# Patient Record
Sex: Male | Born: 1975 | Race: Black or African American | Hispanic: No | Marital: Married | Smoking: Never smoker
Health system: Southern US, Community
[De-identification: ages and names within clinical notes are randomized; demographics above are authoritative.]

## PROBLEM LIST (undated history)

## (undated) DIAGNOSIS — M1990 Unspecified osteoarthritis, unspecified site: Secondary | ICD-10-CM

## (undated) DIAGNOSIS — E781 Pure hyperglyceridemia: Secondary | ICD-10-CM

## (undated) DIAGNOSIS — I10 Essential (primary) hypertension: Secondary | ICD-10-CM

## (undated) DIAGNOSIS — Z227 Latent tuberculosis: Secondary | ICD-10-CM

## (undated) DIAGNOSIS — M21852 Other specified acquired deformities of left thigh: Secondary | ICD-10-CM

## (undated) HISTORY — DX: Other specified acquired deformities of left thigh: M21.852

## (undated) HISTORY — DX: Pure hyperglyceridemia: E78.1

## (undated) HISTORY — DX: Essential (primary) hypertension: I10

---

## 2019-07-04 ENCOUNTER — Encounter: Payer: Self-pay | Admitting: Family Medicine

## 2019-07-04 ENCOUNTER — Other Ambulatory Visit: Payer: Self-pay

## 2019-07-04 ENCOUNTER — Ambulatory Visit (INDEPENDENT_AMBULATORY_CARE_PROVIDER_SITE_OTHER): Payer: Medicaid Other | Admitting: Family Medicine

## 2019-07-04 VITALS — BP 146/86 | HR 84 | Ht 62.5 in | Wt 147.4 lb

## 2019-07-04 DIAGNOSIS — M25552 Pain in left hip: Secondary | ICD-10-CM | POA: Diagnosis not present

## 2019-07-04 DIAGNOSIS — I10 Essential (primary) hypertension: Secondary | ICD-10-CM

## 2019-07-04 DIAGNOSIS — Z23 Encounter for immunization: Secondary | ICD-10-CM | POA: Diagnosis not present

## 2019-07-04 DIAGNOSIS — Z0289 Encounter for other administrative examinations: Secondary | ICD-10-CM

## 2019-07-04 DIAGNOSIS — M21852 Other specified acquired deformities of left thigh: Secondary | ICD-10-CM | POA: Insufficient documentation

## 2019-07-04 LAB — POCT URINALYSIS DIP (MANUAL ENTRY)
Bilirubin, UA: NEGATIVE
Blood, UA: NEGATIVE
Glucose, UA: NEGATIVE mg/dL
Ketones, POC UA: NEGATIVE mg/dL
Leukocytes, UA: NEGATIVE
Nitrite, UA: NEGATIVE
Protein Ur, POC: NEGATIVE mg/dL
Spec Grav, UA: 1.02 (ref 1.010–1.025)
Urobilinogen, UA: 0.2 E.U./dL
pH, UA: 6 (ref 5.0–8.0)

## 2019-07-04 MED ORDER — AMLODIPINE BESYLATE 10 MG PO TABS: 10.0000 mg | ORAL_TABLET | Freq: Every day | ORAL | 3 refills | Status: DC

## 2019-07-04 MED ORDER — LOSARTAN POTASSIUM-HCTZ 50-12.5 MG PO TABS: 1.0000 | ORAL_TABLET | Freq: Every day | ORAL | 3 refills | Status: DC

## 2019-07-04 NOTE — Patient Instructions (Signed)
I will call you with labs  Please go down the street for x-rays  Your blood pressure pills are at your pharmacy  It was wonderful to see you today.  Please bring ALL of your medications with you to every visit.   Thank you for choosing [facility].   Please call [phone] with any questions about today's appointment.  Please be sure to schedule follow up at the front  desk before you leave today.   Terisa Starr, MD  Family Medicine

## 2019-07-04 NOTE — Progress Notes (Signed)
Patient Name: Jerry Patel Date of Birth: [birth year] Date of Visit: 07/04/19 PCP: Jerry Chandler, MD  Chief Complaint: refugee intake examination and leg pain   The patient's preferred language is Swahili. An interpreter was used for the entire visit.  Interpreter Name or ID: Jerry Patel     Subjective: Jerry Patel is a pleasant 44 y.o. presenting today for an initial refugee and immigrant clinic visit.    The patient reports a 10-year history of left leg pain.  He has noticed this when he is playing soccer and feels like he has pain that extends from his hip inferiorly towards his left knee.  He denies a history of trauma.  He reports this onset 10 years ago.  He denies numbness or tingling.  He denies back pain.  He reports weakness in his left leg as compared to his right.  He has noticed a leg length discrepancy.  He has tried nothing for this. He has had no prior imaging. Left leg pain  The patient has a history of hypertension.  He is currently on losartan 50 mg in combination with HCTZ 12.5.  He also takes 10 of amlodipine.  He reports compliance with these medications.  Denies chest pain, vision changes, difficulty breathing.  He reports he was diagnosed with hypertension just a few years ago.  ROS: Negative for headaches, vision changes, chest pain, difficulty breathing. The patient importantly had a chest x-ray overseas which showed pleural thickening and requires follow-up.  PMH: Hypertension Left leg pain for 10 years Abnormal x-ray overseas with negative QuantiFERON gold PSH: No prior surgeries  FH: Reports his father had bilateral leg pain for decades he is unsure what he passed away from.  His mom had a left neck mass that would swell and drain and he believes this resulted in her death.  No other significant family history no family history of early cardiac disease or diabetes  Current Medications:  Losartan 50 mg and combination with HCTZ 12.5 Amlodipine 10  mg  Refugee Information Number of Immediate Family Members: 6(Three children and 2 step children) Number of Immediate Family Members in [place]: 0(Brother lives in [place]) Date of Arrival: 06/21/19 Country of Birth: [facility] Country of Origin: ([place]) [place] Refuge[place]: [place] Duration in [place]: 20 years or greater Reason for Leaving Home Country: Oceanographer opinion Primary Language: Swahili/Kiswahili, English, Other, French(Kibebeme, Mashi, Lingala) Able to Read in Primary Language: Yes Able to Write in Primary Language: Yes Education: [employer] Prior Work: Runner, broadcasting/film/video in [place] Marital Status: Married(Jerry Patel Programmer, systems) Sexual Activity: Yes Tuberculosis Screening Overseas: Negative History of Trauma: Other(Witness to war in [facility]) Do You Feel Jumpy or Nervous?: Yes Are You Very Watchful or 'Super Alert'?: No  The patient's wife's children and stepchildren remain in the [place] and refugee camp at this time.  Date of Overseas Exam: May 02, 2019 Review of Overseas Exam: Today reviewed Pre-Departure Treatment: Will check EDN   Jerry Patel  Vitals:   07/04/19 1341  BP: (!) 146/86  Pulse: 84  SpO2: 100%   HEENT: Sclera anicteric. Dentition is Moderate . Appears well hydrated. Neck: Supple Cardiac: Regular rate and rhythm. Normal S1/S2. No murmurs, rubs, or gallops appreciated. Lungs: Clear bilaterally to ascultation.  Abdomen: Normoactive bowel sounds. No tenderness to deep or light palpation. No rebound or guarding. Splenomegaly-renomegaly.  Mildly enlarged liver. Extremities: Warm, well perfused without edema.  Skin: No rashes or scars Psych: Pleasant and appropriate  MSK: Gait walks with favoring of right  leg as compared to left.  Noticeable difference in muscle bulk of the left quadriceps as compared to right. There is a 1.5 cm leg length discrepancy. Reduced range of motion with internal and external rotation of the hip on the left as compared to right  he has good flexion but limited extension of the left hip.  No tenderness to palpation over the bursa.  No tenderness to palpation of the back he has no pain in his SI joint with external rotation.  Jerry Patel was seen today for establish care.  Diagnoses and all orders for this visit:  Refugee health examination -     RPR -     HIV Antibody (routine testing w rflx) -     CBC with Differential/Platelet -     Comprehensive metabolic panel -     Hepatitis B surface antigen -     Hepatitis B core antibody, total -     Hepatitis B surface antibody,quantitative -     POCT urinalysis dipstick -     Lipid panel -     TSH -     Varicella zoster antibody, IgG -     HCV Ab w Reflex to Quant PCR -     Strongyloides, Ab, IgG -     DG Chest 2 View; Future -we will obtain this due to his history of abnormal x-ray he denies symptoms today. -     QuantiFERON-TB Gold Plus -     Hgb Fractionation Cascade  Need for vaccine for Td (tetanus-diphtheria) -     Tdap vaccine greater than or equal to 7yo IM  Need for hepatitis A immunization -     Hepatitis A vaccine adult IM  Left hip pain differential is broad here I suspect pain this may represent femoral acetabular impingement, although this would be less likely given his leg length discrepancy.  Although he reports this is been ongoing for 10 years I do wonder if he has developmental dysplasia of the left hip that subsequently resulted in avascular necrosis or early osteoarthritis.  Will obtain imaging and likely refer to sports medicine versus orthopedics pending results. -     DG Hip Unilat W OR W/O Pelvis Min 4 Views Left; Future  Essential hypertension slightly above goal.  He had a normal creatinine overseas.  We will repeat his blood pressure at follow-up and titrate medications accordingly.  He is given a refill today on the below medications. -     losartan-hydrochlorothiazide (HYZAAR) 50-12.5 MG tablet; Take 1 tablet by mouth daily. -      amLODipine (NORVASC) 10 MG tablet; Take 1 tablet (10 mg total) by mouth at bedtime.   Return to care in 1 month in [facility] in [place], MD  [facility] Medicine Teaching Service

## 2019-07-06 ENCOUNTER — Other Ambulatory Visit: Payer: Self-pay

## 2019-07-06 ENCOUNTER — Ambulatory Visit
Admission: RE | Admit: 2019-07-06 | Discharge: 2019-07-06 | Disposition: A | Discharge status: Home / Self Care | Payer: Medicaid Other | Source: Ambulatory Visit | Attending: Family Medicine | Admitting: Family Medicine

## 2019-07-06 ENCOUNTER — Other Ambulatory Visit: Payer: Self-pay | Admitting: Family Medicine

## 2019-07-06 DIAGNOSIS — M25552 Pain in left hip: Secondary | ICD-10-CM

## 2019-07-06 DIAGNOSIS — Z0289 Encounter for other administrative examinations: Secondary | ICD-10-CM

## 2019-07-07 LAB — QUANTIFERON-TB GOLD PLUS
QuantiFERON Mitogen Value: >10 IU/mL
QuantiFERON Nil Value: 0.05 IU/mL
QuantiFERON TB1 Ag Value: 3.84 IU/mL
QuantiFERON TB2 Ag Value: 3.42 IU/mL
QuantiFERON-TB Gold Plus: POSITIVE — AB

## 2019-07-07 LAB — COMPREHENSIVE METABOLIC PANEL
ALT: 27 IU/L (ref 0–44)
AST: 25 IU/L (ref 0–40)
Albumin/Globulin Ratio: 1.6 (ref 1.2–2.2)
Albumin: 4.6 g/dL (ref 4.0–5.0)
Alkaline Phosphatase: 85 IU/L (ref 48–121)
BUN/Creatinine Ratio: 14 (ref 9–20)
BUN: 15 mg/dL (ref 6–24)
Bilirubin Total: 0.5 mg/dL (ref 0.0–1.2)
CO2: 24 mmol/L (ref 20–29)
Calcium: 9.6 mg/dL (ref 8.7–10.2)
Chloride: 98 mmol/L (ref 96–106)
Creatinine, Ser: 1.05 mg/dL (ref 0.76–1.27)
GFR calc Af Amer: 101 mL/min/{1.73_m2} (ref >59)
GFR calc non Af Amer: 87 mL/min/{1.73_m2} (ref >59)
Globulin, Total: 2.9 g/dL (ref 1.5–4.5)
Glucose: 107 mg/dL — ABNORMAL HIGH (ref 65–99)
Potassium: 4.1 mmol/L (ref 3.5–5.2)
Sodium: 138 mmol/L (ref 134–144)
Total Protein: 7.5 g/dL (ref 6.0–8.5)

## 2019-07-07 LAB — CBC WITH DIFFERENTIAL/PLATELET
Basophils Absolute: 0 10*3/uL (ref 0.0–0.2)
Basos: 1 %
EOS (ABSOLUTE): 1.1 10*3/uL — ABNORMAL HIGH (ref 0.0–0.4)
Eos: 14 %
Hematocrit: 44.1 % (ref 37.5–51.0)
Hemoglobin: 14.4 g/dL (ref 13.0–17.7)
Immature Grans (Abs): 0 10*3/uL (ref 0.0–0.1)
Immature Granulocytes: 0 %
Lymphocytes Absolute: 2.3 10*3/uL (ref 0.7–3.1)
Lymphs: 31 %
MCH: 27.4 pg (ref 26.6–33.0)
MCHC: 32.7 g/dL (ref 31.5–35.7)
MCV: 84 fL (ref 79–97)
Monocytes Absolute: 0.4 10*3/uL (ref 0.1–0.9)
Monocytes: 5 %
Neutrophils Absolute: 3.7 10*3/uL (ref 1.4–7.0)
Neutrophils: 49 %
Platelets: 299 10*3/uL (ref 150–450)
RBC: 5.26 x10E6/uL (ref 4.14–5.80)
RDW: 12.8 % (ref 11.6–15.4)
WBC: 7.5 10*3/uL (ref 3.4–10.8)

## 2019-07-07 LAB — LIPID PANEL
Chol/HDL Ratio: 3.7 ratio (ref 0.0–5.0)
Cholesterol, Total: 202 mg/dL — ABNORMAL HIGH (ref 100–199)
HDL: 55 mg/dL (ref >39)
LDL Chol Calc (NIH): 97 mg/dL (ref 0–99)
Triglycerides: 300 mg/dL — ABNORMAL HIGH (ref 0–149)
VLDL Cholesterol Cal: 50 mg/dL — ABNORMAL HIGH (ref 5–40)

## 2019-07-07 LAB — HIV ANTIBODY (ROUTINE TESTING W REFLEX): HIV Screen 4th Generation wRfx: NONREACTIVE

## 2019-07-07 LAB — HGB FRACTIONATION CASCADE
Hgb A2: 2.7 % (ref 1.8–3.2)
Hgb A: 97.3 % (ref 96.4–98.8)
Hgb F: 0 % (ref 0.0–2.0)
Hgb S: 0 %

## 2019-07-07 LAB — HCV AB W REFLEX TO QUANT PCR: HCV Ab: <0.1 s/co ratio (ref 0.0–0.9)

## 2019-07-07 LAB — HEPATITIS B CORE ANTIBODY, TOTAL: Hep B Core Total Ab: NEGATIVE

## 2019-07-07 LAB — TSH: TSH: 0.878 u[IU]/mL (ref 0.450–4.500)

## 2019-07-07 LAB — HEPATITIS B SURFACE ANTIBODY, QUANTITATIVE: Hepatitis B Surf Ab Quant: >1000 m[IU]/mL (ref >9.9)

## 2019-07-07 LAB — HCV INTERPRETATION

## 2019-07-07 LAB — HEPATITIS B SURFACE ANTIGEN: Hepatitis B Surface Ag: NEGATIVE

## 2019-07-07 LAB — VARICELLA ZOSTER ANTIBODY, IGG: Varicella zoster IgG: 615 index (ref >165)

## 2019-07-07 LAB — RPR: RPR Ser Ql: NONREACTIVE

## 2019-07-11 ENCOUNTER — Telehealth: Payer: Self-pay | Admitting: Family Medicine

## 2019-07-11 NOTE — Telephone Encounter (Signed)
Faxed required information to below number.   Veronda Prude, RN

## 2019-07-11 NOTE — Telephone Encounter (Signed)
IGRA positive, CXR (repeat) negative.   Called Jerry Patel re: latent Tb therapy.   Please fax Tb result and CXR result (in RN clinic box) to Jerry Patel  [phone]  Terisa Starr, MD  Family Medicine Teaching Service

## 2019-07-12 ENCOUNTER — Encounter: Payer: Self-pay | Admitting: Family Medicine

## 2019-07-12 ENCOUNTER — Telehealth: Payer: Self-pay | Admitting: Family Medicine

## 2019-07-12 NOTE — Telephone Encounter (Signed)
The patient speaks Swahili as their primary language.  An interpreter was used for the entire visit.   Left generic voicemail to call back.   Will send letter.  Has follow up scheduled.   Left Hip Dysplasia- Referral to [facility].  Latent Tb- already notified HD. Eosinophilia- Still awaiting strongyloides.   Terisa Starr, MD  Family Medicine Teaching Service

## 2019-07-14 LAB — STRONGYLOIDES, AB, IGG: Strongyloides, Ab, IgG: POSITIVE — AB

## 2019-07-20 ENCOUNTER — Telehealth: Payer: Self-pay | Admitting: Family Medicine

## 2019-07-20 DIAGNOSIS — Q6589 Other specified congenital deformities of hip: Secondary | ICD-10-CM

## 2019-07-20 NOTE — Telephone Encounter (Addendum)
The patient speaks Swahili as their primary language.  An interpreter was used for the entire visit.   Left hip dysplasia, discussed. Recommended referral to Orthopedics. Recommend Dr. Magnus Ivan.   Strongyloides- Will need [place] thick/thin smear between 10 AM to 2 PM.   Latent Tb- already called health department about latent Tb. Informed patient, he is asymptomatic.  Already started Rx.   All questions answered.   Terisa Starr, MD  Family Medicine Teaching Service

## 2019-07-25 ENCOUNTER — Ambulatory Visit (INDEPENDENT_AMBULATORY_CARE_PROVIDER_SITE_OTHER): Payer: Medicaid Other | Admitting: Family Medicine

## 2019-07-25 ENCOUNTER — Other Ambulatory Visit: Payer: Self-pay

## 2019-07-25 VITALS — BP 146/88 | HR 64 | Ht 63.0 in | Wt 150.0 lb

## 2019-07-25 DIAGNOSIS — I10 Essential (primary) hypertension: Secondary | ICD-10-CM

## 2019-07-25 DIAGNOSIS — Z227 Latent tuberculosis: Secondary | ICD-10-CM | POA: Diagnosis not present

## 2019-07-25 DIAGNOSIS — D7219 Other eosinophilia: Secondary | ICD-10-CM

## 2019-07-25 DIAGNOSIS — M21852 Other specified acquired deformities of left thigh: Secondary | ICD-10-CM | POA: Diagnosis not present

## 2019-07-25 DIAGNOSIS — D721 Eosinophilia, unspecified: Secondary | ICD-10-CM | POA: Insufficient documentation

## 2019-07-25 DIAGNOSIS — D7218 Eosinophilia in diseases classified elsewhere: Secondary | ICD-10-CM | POA: Diagnosis present

## 2019-07-25 DIAGNOSIS — E781 Pure hyperglyceridemia: Secondary | ICD-10-CM

## 2019-07-25 MED ORDER — LOSARTAN POTASSIUM-HCTZ 50-12.5 MG PO TABS: 2.0000 | ORAL_TABLET | Freq: Every day | ORAL | 3 refills | Status: DC

## 2019-07-25 NOTE — Progress Notes (Addendum)
    SUBJECTIVE:   CHIEF COMPLAINT / HPI:   An in-person Swahili interpreter was present throughout entire visit  Left leg pain Patient reports that he continues to have left leg pain, although he has an appointment with an orthopedist tomorrow to discuss this further.  He says that it hurts most when he has been walking for a prolonged period of time.  He is able to play soccer and do most activities, but his leg pain continues to be a problem especially with activity.  He denies any numbness, tingling, and weakness.  Latent tuberculosis Patient says that he has started a regimen prescribed by the health department.  He has not had any difficulties with the medication so far.  He understands that he will need to take this regimen for several months.  Hypertension Patient reports that he continues to take amlodipine 10 mg and losartan-HCTZ 50-12.5 mg daily.  He took these medications today.  He has had no concerning side effects, although he is worried that his blood pressure is too high today.  Hypertriglyceridemia When asked about alcohol consumption, patient reported that he did used to drink 2-3 servings of alcohol per day but quit 2 weeks ago when he started his medication for hypertension.  He has had no difficulties abstaining from alcohol since then.  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  No tenderness to palpation along the ischial spine, lateral hip, and groin.   Logroll is negative.  ROM is reduced on FADIR and FABER but normal on flexion and extension.  5/5 strength on hip flexion, extension, adduction, and abduction.  Normal gait. Psych: appropriate mood and affect    ASSESSMENT/PLAN:   Essential hypertension Above goal today, even after recheck.  We will increase patient's losartan-HCTZ to 100-25 mg daily.  In order to keep his regimen simple, will have him take 2 of these pills per day.  We will continue amlodipine 10 mg.  Patient has a follow-up appointment on July 16, when he can get a BMP to monitor his kidney function and a blood pressure recheck.  Acquired dysplasia of hip, left Reviewed with the patient that his hip pain is due to a structural deformity that is best addressed by the orthopedist, so his appointment tomorrow will be very important.  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  We will notify him of these results and plan for treatment once results return.  Latent tuberculosis Discussed the importance of treating this, and ensured that patient was receiving treatment with the health department.  Hypertriglyceridemia Congratulated patient on his abstinence from alcohol and discussed the downstream effects of alcohol overuse.     10-16-1988, MD [facility]

## 2019-07-25 NOTE — Assessment & Plan Note (Signed)
Reviewed with the patient that his hip pain is due to a structural deformity that is best addressed by the orthopedist, so his appointment tomorrow will be very important.

## 2019-07-25 NOTE — Assessment & Plan Note (Signed)
Thick and thin parasite smear obtained today before 2 PM.  We will notify him of these results and plan for treatment once results return.

## 2019-07-25 NOTE — Assessment & Plan Note (Signed)
Congratulated patient on his abstinence from alcohol and discussed the downstream effects of alcohol overuse.

## 2019-07-25 NOTE — Assessment & Plan Note (Signed)
Above goal today, even after recheck.  We will increase patient's losartan-HCTZ to 100-25 mg daily.  In order to keep his regimen simple, will have him take 2 of these pills per day.  We will continue amlodipine 10 mg.  Patient has a follow-up appointment on July 16, when he can get a BMP to monitor his kidney function and a blood pressure recheck.

## 2019-07-25 NOTE — Patient Instructions (Addendum)
It was nice meeting you today Mr. Jerry Patel!  Please increase your losartan to 2 pills/day.  Dr. Manson Passey will call you with your lab results.  Please go to your appointment with the bone doctor tomorrow.  Please continue to take medicine for your tuberculosis.  We will see you again on July 16 at 9:30 with Dr. Manson Passey to follow-up on your blood pressure.  If you have any questions or concerns, please feel free to call the clinic.   Be well,  Dr. Frances Furbish

## 2019-07-25 NOTE — Assessment & Plan Note (Signed)
Discussed the importance of treating this, and ensured that patient was receiving treatment with the health department.

## 2019-07-26 ENCOUNTER — Ambulatory Visit (INDEPENDENT_AMBULATORY_CARE_PROVIDER_SITE_OTHER): Payer: Medicaid Other

## 2019-07-26 ENCOUNTER — Telehealth: Payer: Self-pay | Admitting: Family Medicine

## 2019-07-26 ENCOUNTER — Encounter: Payer: Self-pay | Admitting: Orthopaedic Surgery

## 2019-07-26 ENCOUNTER — Ambulatory Visit (INDEPENDENT_AMBULATORY_CARE_PROVIDER_SITE_OTHER): Payer: Medicaid Other | Admitting: Orthopaedic Surgery

## 2019-07-26 DIAGNOSIS — B789 Strongyloidiasis, unspecified: Secondary | ICD-10-CM

## 2019-07-26 DIAGNOSIS — M21852 Other specified acquired deformities of left thigh: Secondary | ICD-10-CM | POA: Diagnosis not present

## 2019-07-26 NOTE — Telephone Encounter (Signed)
The patient speaks Swahili as their primary language.  An interpreter was used for the entire visit.   Called with preliminary negative, will await final result before Rx for ivermectin.

## 2019-07-26 NOTE — Progress Notes (Signed)
Office Visit Note   Patient: Jerry Patel           Date of Birth: 1976-01-26           MRN: [id number] Visit Date: 07/26/2019              Requested by: [name], MD [address] [place],  [place] [address] PCP: [name], MD   Assessment & Plan: Visit Diagnoses:  1. Acquired dysplasia of hip, left     Plan: Impression is left hip dysplasia and secondary DJD.  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  Unfortunately I do not feel that any nonsurgical treatments are going to give him any prolonged relief as he has a severe structural problem.  Based on discussion on risks benefits rehab recovery alternatives to left total hip replacement he has elected to proceed with the surgery in the near future.  We will first obtain a CT scan to better evaluate his left hip joint and for surgical planning.  We will call the patient to schedule the surgery once we have the results of the CT scan.  Follow-Up Instructions: Return if symptoms worsen or fail to improve.   Orders:  Orders Placed This Encounter  Procedures  . XR HIP UNILAT W OR W/O PELVIS 2-3 VIEWS LEFT  . CT PELVIS WO CONTRAST   No orders of the defined types were placed in this encounter.     Procedures: No procedures performed   Clinical Data: No additional findings.   Subjective: Chief Complaint  Patient presents with  . Left Hip - Pain    Patient is a 44 year old African gentleman who comes in with his interpreter for chronic left hip pain.  He has had left hip and groin pain for 15 years.  He states that he was diagnosed with a hip issue as a child.  He has pain with walking long distances.  Currently just taking over-the-counter medications.  He is severely limited by his left hip and groin pain at this point.   Review of Systems  Constitutional: Negative.   All other systems reviewed and are negative.    Objective: Vital Signs: There were  no vitals taken for this visit.  Physical Exam Vitals and nursing note reviewed.  Constitutional:      Appearance: He is well-developed.  HENT:     Head: Normocephalic and atraumatic.  Eyes:     Pupils: Pupils are equal, round, and reactive to light.  Pulmonary:     Effort: Pulmonary effort is normal.  Abdominal:     Palpations: Abdomen is soft.  Musculoskeletal:        General: Normal range of motion.     Cervical back: Neck supple.  Skin:    General: Skin is warm.  Neurological:     Mental Status: He is alert and oriented to person, place, and time.  Psychiatric:        Behavior: Behavior normal.        Thought Content: Thought content normal.        Judgment: Judgment normal.     Ortho Exam Left lower extremity shows about a 1inch leg length discrepancy.  He has minimal internal rotation with pain no lateral hip tenderness.  Positive logroll. Specialty Comments:  No specialty comments available.  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 Latent tuberculosis 07/25/2019  . Hypertriglyceridemia 07/25/2019  . Refugee health examination 07/04/2019  . Essential hypertension 07/04/2019  . Acquired dysplasia of hip, left 07/04/2019   Past Medical History:  Diagnosis Date  . Hypertension     History reviewed. No pertinent family history.  History reviewed. No pertinent surgical history. Social History   Occupational History  . Not on file  Tobacco Use  . Smoking status: Never Smoker  . Smokeless tobacco: Never Used  Substance and Sexual Activity  . Alcohol use: Yes    Alcohol/week: 2.0 standard drinks    Types: 2 Cans of beer per week  . Drug use: Never  . Sexual activity: Yes    Partners: Female    Comment: Wife- [name]- in  [place]

## 2019-08-01 LAB — PARASITE EXAM, BLOOD

## 2019-08-03 ENCOUNTER — Telehealth: Payer: Self-pay | Admitting: Family Medicine

## 2019-08-03 MED ORDER — IVERMECTIN 3 MG PO TABS: 200.0000 ug/kg | ORAL_TABLET | Freq: Once | ORAL | 0 refills | Status: AC

## 2019-08-03 NOTE — Telephone Encounter (Signed)
The patient speaks Swahili as their primary language.  An interpreter was used for the entire visit.   Called patient with positive strongyloides, from [place] endemic area. Thick and thin smear negative for [place].   Rx for single 200 mcg/kg/day of ivermectin to pharmacy. All questions answered.   Terisa Starr, MD  Family Medicine Teaching Service

## 2019-08-07 ENCOUNTER — Ambulatory Visit
Admission: RE | Admit: 2019-08-07 | Discharge: 2019-08-07 | Disposition: A | Discharge status: Home / Self Care | Payer: Medicaid Other | Source: Ambulatory Visit | Attending: Orthopaedic Surgery | Admitting: Orthopaedic Surgery

## 2019-08-07 DIAGNOSIS — M21852 Other specified acquired deformities of left thigh: Secondary | ICD-10-CM

## 2019-08-08 NOTE — Progress Notes (Signed)
Left voicemail with patient to call back

## 2019-08-09 ENCOUNTER — Telehealth: Payer: Self-pay

## 2019-08-09 NOTE — Telephone Encounter (Signed)
Tarry Kos, MD  Jerry Patel, RMA Left voicemail with patient to call back.       CT PELVIS WO CONTRAST

## 2019-08-25 ENCOUNTER — Encounter: Payer: Self-pay | Admitting: Family Medicine

## 2019-08-25 ENCOUNTER — Telehealth: Payer: Self-pay | Admitting: Orthopaedic Surgery

## 2019-08-25 ENCOUNTER — Ambulatory Visit: Payer: Medicaid Other | Admitting: Family Medicine

## 2019-08-25 ENCOUNTER — Other Ambulatory Visit: Payer: Self-pay

## 2019-08-25 VITALS — BP 118/82 | HR 69 | Wt 147.2 lb

## 2019-08-25 DIAGNOSIS — D7219 Other eosinophilia: Secondary | ICD-10-CM

## 2019-08-25 DIAGNOSIS — Z227 Latent tuberculosis: Secondary | ICD-10-CM

## 2019-08-25 DIAGNOSIS — M21852 Other specified acquired deformities of left thigh: Secondary | ICD-10-CM

## 2019-08-25 DIAGNOSIS — I10 Essential (primary) hypertension: Secondary | ICD-10-CM | POA: Diagnosis not present

## 2019-08-25 NOTE — Patient Instructions (Addendum)
It was wonderful to see you today.  Please bring ALL of your medications with you to every visit.   Today we talked about:  - Call Dr. Warren Danes office about your CT scan results [phone]  - I will call with results of blood work      Thank you for choosing [facility].   Please call [phone] with any questions about today's appointment.  Please be sure to schedule follow up at the front  desk before you leave today.   Terisa Starr, MD  Family Medicine

## 2019-08-25 NOTE — Telephone Encounter (Signed)
Patient called. He would like a call from  Dr. Roda Shutters. concerning test results. His call back number is [phone]

## 2019-08-25 NOTE — Telephone Encounter (Signed)
Jerry Patel will call with interpreter on the phone.

## 2019-08-25 NOTE — Assessment & Plan Note (Signed)
Follow up at next visit to ensure completion of therapy.

## 2019-08-25 NOTE — Assessment & Plan Note (Signed)
Repeat CBC in fall 2021. Discussed.

## 2019-08-25 NOTE — Assessment & Plan Note (Signed)
Discussed CT with patient briefly, number given to Orthopedics. Messaged RMA as patient reports he has had difficulty calling due to language barrier.

## 2019-08-25 NOTE — Progress Notes (Signed)
    SUBJECTIVE:   CHIEF COMPLAINT / HPI:   The patient speaks Swahili as their primary language.  An interpreter was used for the entire visit.   Jerry Patel is a 44 year old with history of latent Tb, left hip dysplasia, hypertension, and hypertriglyceridemia presenting for follow up.  The patient reports he would like to review CT findings. Endorses left  Hip pain at work. He works in a plant and moves boxes all day. His left hip intermittently causes pain. This limits his ability to work and play soccer. Pain is controlled with Tylenol. Denies numbness, weakness, falls.  He reports compliance with his amlodipine, losartan, and HCTZ. Denies dizziness, chest pain, shortness of breath, headaches. He is working on dietary changes.   He received his first dose of COVID vaccine, next due on 7/24.   PERTINENT  PMH / PSH/Family/Social History :  Latent Tb Hypertriglyceridemia Hypertension    OBJECTIVE:   BP 118/82   Pulse 69   Wt 147 lb 3.2 oz (66.8 kg)   SpO2 98%   BMI 26.08 kg/m   HEENT: Sclera anicteric. Dentition is moderate. Appears well hydrated. Neck: Supple Cardiac: Regular rate and rhythm. Normal S1/S2. No murmurs, rubs, or gallops appreciated. Lungs: Clear bilaterally to ascultation.   Skin: Warm, dry Psych: Pleasant and appropriate   ASSESSMENT/PLAN:   Acquired dysplasia of hip, left Discussed CT with patient briefly, number given to Orthopedics. Messaged RMA as patient reports he has had difficulty calling due to language barrier.   Latent tuberculosis Follow up at next visit to ensure completion of therapy.   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 Discussed.   Essential hypertension Reviewed medications, well controlled, will continue HCTZ/Losartan/Norvasc.  BMP today as recently restarted diuretic, requires monitoring.    Terisa Starr, MD  Family Medicine Teaching Service  [facility]

## 2019-08-25 NOTE — Assessment & Plan Note (Signed)
Reviewed medications, well controlled, will continue HCTZ/Losartan/Norvasc.  BMP today as recently restarted diuretic, requires monitoring.

## 2019-08-26 LAB — BASIC METABOLIC PANEL
BUN/Creatinine Ratio: 14 (ref 9–20)
BUN: 15 mg/dL (ref 6–24)
CO2: 26 mmol/L (ref 20–29)
Calcium: 9.4 mg/dL (ref 8.7–10.2)
Chloride: 98 mmol/L (ref 96–106)
Creatinine, Ser: 1.09 mg/dL (ref 0.76–1.27)
GFR calc Af Amer: 96 mL/min/{1.73_m2} (ref >59)
GFR calc non Af Amer: 83 mL/min/{1.73_m2} (ref >59)
Glucose: 86 mg/dL (ref 65–99)
Potassium: 3.8 mmol/L (ref 3.5–5.2)
Sodium: 138 mmol/L (ref 134–144)

## 2019-08-27 ENCOUNTER — Encounter: Payer: Self-pay | Admitting: Family Medicine

## 2019-09-12 ENCOUNTER — Ambulatory Visit (INDEPENDENT_AMBULATORY_CARE_PROVIDER_SITE_OTHER): Payer: Medicaid Other | Admitting: Orthopaedic Surgery

## 2019-09-12 ENCOUNTER — Encounter: Payer: Self-pay | Admitting: Orthopaedic Surgery

## 2019-09-12 DIAGNOSIS — M21852 Other specified acquired deformities of left thigh: Secondary | ICD-10-CM

## 2019-09-12 NOTE — Progress Notes (Signed)
   Office Visit Note   Patient: Jerry Patel           Date of Birth: 10/08/1975           MRN: [id number] Visit Date: 09/12/2019              Requested by: Jerry Chandler, MD [address] [place],  [place] [address] PCP: Jerry Chandler, MD   Assessment & Plan: Visit Diagnoses:  1. Acquired dysplasia of hip, left     Plan: I reviewed the CT scan with the patient in detail.  He has a collapsed femoral head and dysplasia of the left acetabulum with 2 intra-articular loose bodies.  He has large subchondral cysts consistent with severe degenerative joint disease.  All these findings were reviewed with the patient in detail and recommendation has been made for a total hip replacement.  Risk benefits rehab recovery all reviewed today with the patient in detail through the interpreter.  Questions encouraged and answered.  He would like to move forward with the surgery as soon as possible. Total face to face encounter time was greater than 25 minutes and over half of this time was spent in counseling and/or coordination of care.  Follow-Up Instructions: Return for 2 week postop visit.   Orders:  No orders of the defined types were placed in this encounter.  No orders of the defined types were placed in this encounter.     Procedures: No procedures performed   Clinical Data: No additional findings.   Subjective: Chief Complaint  Patient presents with  . Left Hip - Pain, Follow-up    Patient returns today with his interpreter.  Recently had CT scanning for surgical planning.  No changes in hip symptoms.   Review of Systems   Objective: Vital Signs: There were no vitals taken for this visit.  Physical Exam  Ortho Exam Left hip exam is unchanged. Specialty Comments:  No specialty comments available.  Imaging: No results found.   PMFS History: Patient Active Problem List   Diagnosis Date Noted  . 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 Latent  tuberculosis 07/25/2019  . Hypertriglyceridemia 07/25/2019  . Refugee health examination 07/04/2019  . Essential hypertension 07/04/2019  . Acquired dysplasia of hip, left 07/04/2019   Past Medical History:  Diagnosis Date  . Hip dysplasia, acquired, left   . Hypertension   . Hypertriglyceridemia   . TB lung, latent     History reviewed. No pertinent family history.  History reviewed. No pertinent surgical history. Social History   Occupational History  . Not on file  Tobacco Use  . Smoking status: Never Smoker  . Smokeless tobacco: Never Used  Substance and Sexual Activity  . Alcohol use: Yes    Alcohol/week: 2.0 standard drinks    Types: 2 Cans of beer per week  . Drug use: Never  . Sexual activity: Yes    Partners: Female    Comment: Wife- Jerry Patel- in [place]

## 2019-09-18 ENCOUNTER — Telehealth: Payer: Self-pay | Admitting: *Deleted

## 2019-09-18 NOTE — Telephone Encounter (Signed)
-----   Message from [name], MD sent at 09/11/2019  7:26 AM EDT ----- Regarding: Sept Appt Hi Red Team,  Please schedule this patient for follow up with me for labs sometime in September.  Thanks, Terisa Starr, MD  [facility] Medicine Teaching Service

## 2019-09-18 NOTE — Telephone Encounter (Signed)
Pt informed and scheduled using swahilli interpreter Arley Phenix [phone]. Jamoni Broadfoot Bruna Potter, CMA

## 2019-09-19 ENCOUNTER — Other Ambulatory Visit: Payer: Self-pay

## 2019-09-26 ENCOUNTER — Encounter (HOSPITAL_COMMUNITY): Payer: Self-pay | Admitting: Physician Assistant

## 2019-09-26 ENCOUNTER — Encounter (HOSPITAL_COMMUNITY): Payer: Self-pay

## 2019-09-26 NOTE — Progress Notes (Addendum)
COVID Vaccine Completed: Yes Date COVID Vaccine completed: 09/02/19 COVID vaccine manufacturer: Pfizer       PCP - Dr. Terisa Starr last office visit note 08/25/19 in epic Cardiologist - N/A  Chest x-ray - 07/06/19 in epic EKG - 09/29/19 in epic (pre op) Stress Test - N/A ECHO - N/A Cardiac Cath - N/A  Sleep Study - N/A CPAP - N/A  Fasting Blood Sugar - N/A Checks Blood Sugar __N/A___ times a day  Blood Thinner Instructions: N/A Aspirin Instructions:N/A Last Dose:N/A  Anesthesia review: Undergoing treatment for latent TB  Patient denies shortness of breath, fever, cough and chest pain at PAT appointment   Patient verbalized understanding of instructions that were given to them at the PAT appointment. Patient was also instructed that they will need to review over the PAT instructions again at home before surgery.

## 2019-09-26 NOTE — Patient Instructions (Signed)
DUE TO COVID-19 ONLY ONE VISITOR IS ALLOWED TO COME WITH YOU AND STAY IN THE WAITING ROOM ONLY DURING PRE OP AND PROCEDURE.   IF YOU WILL BE ADMITTED INTO THE HOSPITAL YOU ARE ALLOWED ONE SUPPORT PERSON DURING VISITATION HOURS ONLY (10AM -8PM)   . The support person may change daily. . The support person must pass our screening, gel in and out, and wear a mask at all times, including in the patient's room. . Patients must also wear a mask when staff or their support person are in the room.   COVID SWAB TESTING MUST BE COMPLETED ON:  Friday, Aug. 20, 2021 at 12:15PM   [address] [place], [place] [address]  (Must self quarantine after testing. Follow instructions on handout.)       Your procedure is scheduled on: Monday, Aug, 23, 2021   Report to [facility] Main  Entrance   Report to Short Stay at 5:30 AM   [facility])   Call this number if you have problems the morning of surgery [phone]   Do not eat food :After Midnight.   May have liquids until    day of surgery  CLEAR LIQUID DIET  Foods Allowed                                                                     Foods Excluded  Water, Black Coffee and tea, regular and decaf                             liquids that you cannot  Plain Jell-O in any flavor  (No red)                                           see through such as: Fruit ices (not with fruit pulp)                                     milk, soups, orange juice              Iced Popsicles (No red)                                    All solid food                                   Apple juices Sports drinks like Gatorade (No red) Lightly seasoned clear broth or consume(fat free) Sugar, honey syrup  Sample Menu Breakfast                                Lunch  Supper Cranberry juice                    Beef broth                            Chicken broth Jell-O                                     Grape juice                            Apple juice Coffee or tea                        Jell-O                                      Popsicle                                                Coffee or tea                        Coffee or tea      Complete one Ensure drink the morning of surgery at 4:30 AM the day of surgery.   Oral Hygiene is also important to reduce your risk of infection.                                    Remember - BRUSH YOUR TEETH THE MORNING OF SURGERY WITH YOUR REGULAR TOOTHPASTE   Do NOT smoke after Midnight   Take these medicines the morning of surgery with A SIP OF WATER: None                               You may not have any metal on your body including jewelry, and body piercings             Do not wear lotions, powders, perfumes/cologne, or deodorant                           Men may shave face and neck.   Do not bring valuables to the hospital. [facility] IS NOT             RESPONSIBLE   FOR VALUABLES.   Contacts, dentures or bridgework may not be worn into surgery.   Bring small overnight bag day of surgery.    Special Instructions: Bring a copy of your healthcare power of attorney and living will documents         the day of surgery if you haven't scanned them in before.              Please read over the following fact sheets you were given: IF YOU HAVE QUESTIONS ABOUT YOUR PRE OP INSTRUCTIONS PLEASE CALL [phone]   [facility] - Preparing for Surgery Before surgery, you can play an important role.  Because skin is not sterile, your skin needs to be as free of germs as possible.  You can reduce the number of germs on your skin by washing with CHG (chlorahexidine gluconate) soap before surgery.  CHG is an antiseptic cleaner which kills germs and bonds with the skin to continue killing germs even after washing. Please DO NOT use if you have an allergy to CHG or antibacterial soaps.  If your skin becomes reddened/irritated stop using the CHG and inform your nurse when you  arrive at Short Stay. Do not shave (including legs and underarms) for at least 48 hours prior to the first CHG shower.  You may shave your face/neck.  Please follow these instructions carefully:  1.  Shower with CHG Soap the night before surgery and the  morning of surgery.  2.  If you choose to wash your hair, wash your hair first as usual with your normal  shampoo.  3.  After you shampoo, rinse your hair and body thoroughly to remove the shampoo.                             4.  Use CHG as you would any other liquid soap.  You can apply chg directly to the skin and wash.  Gently with a scrungie or clean washcloth.  5.  Apply the CHG Soap to your body ONLY FROM THE NECK DOWN.   Do   not use on face/ open                           Wound or open sores. Avoid contact with eyes, ears mouth and   genitals (private parts).                       Wash face,  Genitals (private parts) with your normal soap.             6.  Wash thoroughly, paying special attention to the area where your    surgery  will be performed.  7.  Thoroughly rinse your body with warm water from the neck down.  8.  DO NOT shower/wash with your normal soap after using and rinsing off the CHG Soap.                9.  Pat yourself dry with a clean towel.            10.  Wear clean pajamas.            11.  Place clean sheets on your bed the night of your first shower and do not  sleep with pets. Day of Surgery : Do not apply any lotions/deodorants the morning of surgery.  Please wear clean clothes to the hospital/surgery center.  FAILURE TO FOLLOW THESE INSTRUCTIONS MAY RESULT IN THE CANCELLATION OF YOUR SURGERY  PATIENT SIGNATURE_________________________________  NURSE SIGNATURE__________________________________  ________________________________________________________________________   Jerry Patel  An incentive spirometer is a tool that can help keep your lungs clear and active. This tool measures how well you are  filling your lungs with each breath. Taking long deep breaths may help reverse or decrease the chance of developing breathing (pulmonary) problems (especially infection) following:  A long period of time when you are unable to move or be active. BEFORE THE PROCEDURE   If the spirometer includes an indicator to show your best effort, your nurse or  respiratory therapist will set it to a desired goal.  If possible, sit up straight or lean slightly forward. Try not to slouch.  Hold the incentive spirometer in an upright position. INSTRUCTIONS FOR USE  1. Sit on the edge of your bed if possible, or sit up as far as you can in bed or on a chair. 2. Hold the incentive spirometer in an upright position. 3. Breathe out normally. 4. Place the mouthpiece in your mouth and seal your lips tightly around it. 5. Breathe in slowly and as deeply as possible, raising the piston or the ball toward the top of the column. 6. Hold your breath for 3-5 seconds or for as long as possible. Allow the piston or ball to fall to the bottom of the column. 7. Remove the mouthpiece from your mouth and breathe out normally. 8. Rest for a few seconds and repeat Steps 1 through 7 at least 10 times every 1-2 hours when you are awake. Take your time and take a few normal breaths between deep breaths. 9. The spirometer may include an indicator to show your best effort. Use the indicator as a goal to work toward during each repetition. 10. After each set of 10 deep breaths, practice coughing to be sure your lungs are clear. If you have an incision (the cut made at the time of surgery), support your incision when coughing by placing a pillow or rolled up towels firmly against it. Once you are able to get out of bed, walk around indoors and cough well. You may stop using the incentive spirometer when instructed by your caregiver.  RISKS AND COMPLICATIONS  Take your time so you do not get dizzy or light-headed.  If you are in pain,  you may need to take or ask for pain medication before doing incentive spirometry. It is harder to take a deep breath if you are having pain. AFTER USE  Rest and breathe slowly and easily.  It can be helpful to keep track of a log of your progress. Your caregiver can provide you with a simple table to help with this. If you are using the spirometer at home, follow these instructions: SEEK MEDICAL CARE IF:   You are having difficultly using the spirometer.  You have trouble using the spirometer as often as instructed.  Your pain medication is not giving enough relief while using the spirometer.  You develop fever of 100.5 F (38.1 C) or higher. SEEK IMMEDIATE MEDICAL CARE IF:   You cough up bloody sputum that had not been present before.  You develop fever of 102 F (38.9 C) or greater.  You develop worsening pain at or near the incision site. MAKE SURE YOU:   Understand these instructions.  Will watch your condition.  Will get help right away if you are not doing well or get worse. Document Released: 06/08/2006 Document Revised: 04/20/2011 Document Reviewed: 08/09/2006 ExitCare Patient Information 2014 [employer], [place].   ________________________________________________________________________  WHAT IS A BLOOD TRANSFUSION? Blood Transfusion Information  A transfusion is the replacement of blood or some of its parts. Blood is made up of multiple cells which provide different functions.  Red blood cells carry oxygen and are used for blood loss replacement.  White blood cells fight against infection.  Platelets control bleeding.  Plasma helps clot blood.  Other blood products are available for specialized needs, such as hemophilia or other clotting disorders. BEFORE THE TRANSFUSION  Who gives blood for transfusions?   Healthy volunteers who  are fully evaluated to make sure their blood is safe. This is blood bank blood. Transfusion therapy is the safest it has ever  been in the practice of medicine. Before blood is taken from a donor, a complete history is taken to make sure that person has no history of diseases nor engages in risky social behavior (examples are intravenous drug use or sexual activity with multiple partners). The donor's travel history is screened to minimize risk of transmitting infections, such as malaria. The donated blood is tested for signs of infectious diseases, such as HIV and hepatitis. The blood is then tested to be sure it is compatible with you in order to minimize the chance of a transfusion reaction. If you or a relative donates blood, this is often done in anticipation of surgery and is not appropriate for emergency situations. It takes many days to process the donated blood. RISKS AND COMPLICATIONS Although transfusion therapy is very safe and saves many lives, the main dangers of transfusion include:   Getting an infectious disease.  Developing a transfusion reaction. This is an allergic reaction to something in the blood you were given. Every precaution is taken to prevent this. The decision to have a blood transfusion has been considered carefully by your caregiver before blood is given. Blood is not given unless the benefits outweigh the risks. AFTER THE TRANSFUSION  Right after receiving a blood transfusion, you will usually feel much better and more energetic. This is especially true if your red blood cells have gotten low (anemic). The transfusion raises the level of the red blood cells which carry oxygen, and this usually causes an energy increase.  The nurse administering the transfusion will monitor you carefully for complications. HOME CARE INSTRUCTIONS  No special instructions are needed after a transfusion. You may find your energy is better. Speak with your caregiver about any limitations on activity for underlying diseases you may have. SEEK MEDICAL CARE IF:   Your condition is not improving after your  transfusion.  You develop redness or irritation at the intravenous (IV) site. SEEK IMMEDIATE MEDICAL CARE IF:  Any of the following symptoms occur over the next 12 hours:  Shaking chills.  You have a temperature by mouth above 102 F (38.9 C), not controlled by medicine.  Chest, back, or muscle pain.  People around you feel you are not acting correctly or are confused.  Shortness of breath or difficulty breathing.  Dizziness and fainting.  You get a rash or develop hives.  You have a decrease in urine output.  Your urine turns a dark color or changes to pink, red, or brown. Any of the following symptoms occur over the next 10 days:  You have a temperature by mouth above 102 F (38.9 C), not controlled by medicine.  Shortness of breath.  Weakness after normal activity.  The white part of the eye turns yellow (jaundice).  You have a decrease in the amount of urine or are urinating less often.  Your urine turns a dark color or changes to pink, red, or brown. Document Released: 01/24/2000 Document Revised: 04/20/2011 Document Reviewed: 09/12/2007 [facility] Patient Information 2014 [place], [place].  _______________________________________________________________________

## 2019-09-29 ENCOUNTER — Encounter (HOSPITAL_COMMUNITY): Payer: Self-pay

## 2019-09-29 ENCOUNTER — Other Ambulatory Visit (HOSPITAL_COMMUNITY)
Admission: RE | Admit: 2019-09-29 | Discharge: 2019-09-29 | Disposition: A | Discharge status: Home / Self Care | Payer: Medicaid Other | Source: Ambulatory Visit | Attending: Orthopaedic Surgery | Admitting: Orthopaedic Surgery

## 2019-09-29 ENCOUNTER — Other Ambulatory Visit: Payer: Self-pay

## 2019-09-29 ENCOUNTER — Encounter (HOSPITAL_COMMUNITY)
Admission: RE | Admit: 2019-09-29 | Discharge: 2019-09-29 | Disposition: A | Discharge status: Home / Self Care | Payer: Medicaid Other | Source: Ambulatory Visit | Attending: Orthopaedic Surgery | Admitting: Orthopaedic Surgery

## 2019-09-29 DIAGNOSIS — Z20822 Contact with and (suspected) exposure to covid-19: Secondary | ICD-10-CM | POA: Diagnosis not present

## 2019-09-29 DIAGNOSIS — Z01818 Encounter for other preprocedural examination: Secondary | ICD-10-CM | POA: Diagnosis not present

## 2019-09-29 HISTORY — DX: Unspecified osteoarthritis, unspecified site: M19.90

## 2019-09-29 HISTORY — DX: Latent tuberculosis: Z22.7

## 2019-09-29 LAB — APTT: aPTT: 30 seconds (ref 24–36)

## 2019-09-29 LAB — COMPREHENSIVE METABOLIC PANEL
ALT: 18 U/L (ref 0–44)
AST: 25 U/L (ref 15–41)
Albumin: 4.4 g/dL (ref 3.5–5.0)
Alkaline Phosphatase: 50 U/L (ref 38–126)
Anion gap: 9 (ref 5–15)
BUN: 12 mg/dL (ref 6–20)
CO2: 30 mmol/L (ref 22–32)
Calcium: 9.3 mg/dL (ref 8.9–10.3)
Chloride: 101 mmol/L (ref 98–111)
Creatinine, Ser: 0.95 mg/dL (ref 0.61–1.24)
GFR calc Af Amer: >60 mL/min (ref >60)
GFR calc non Af Amer: >60 mL/min (ref >60)
Glucose, Bld: 98 mg/dL (ref 70–99)
Potassium: 3.7 mmol/L (ref 3.5–5.1)
Sodium: 140 mmol/L (ref 135–145)
Total Bilirubin: 1.8 mg/dL — ABNORMAL HIGH (ref 0.3–1.2)
Total Protein: 7.7 g/dL (ref 6.5–8.1)

## 2019-09-29 LAB — URINALYSIS, ROUTINE W REFLEX MICROSCOPIC
Bilirubin Urine: NEGATIVE
Glucose, UA: NEGATIVE mg/dL
Hgb urine dipstick: NEGATIVE
Ketones, ur: NEGATIVE mg/dL
Leukocytes,Ua: NEGATIVE
Nitrite: NEGATIVE
Protein, ur: NEGATIVE mg/dL
Specific Gravity, Urine: 1.008 (ref 1.005–1.030)
pH: 6 (ref 5.0–8.0)

## 2019-09-29 LAB — CBC WITH DIFFERENTIAL/PLATELET
Abs Immature Granulocytes: 0.01 10*3/uL (ref 0.00–0.07)
Basophils Absolute: 0 10*3/uL (ref 0.0–0.1)
Basophils Relative: 1 %
Eosinophils Absolute: 0.2 10*3/uL (ref 0.0–0.5)
Eosinophils Relative: 5 %
HCT: 42.3 % (ref 39.0–52.0)
Hemoglobin: 14 g/dL (ref 13.0–17.0)
Immature Granulocytes: 0 %
Lymphocytes Relative: 43 %
Lymphs Abs: 1.7 10*3/uL (ref 0.7–4.0)
MCH: 28.2 pg (ref 26.0–34.0)
MCHC: 33.1 g/dL (ref 30.0–36.0)
MCV: 85.1 fL (ref 80.0–100.0)
Monocytes Absolute: 0.5 10*3/uL (ref 0.1–1.0)
Monocytes Relative: 13 %
Neutro Abs: 1.4 10*3/uL — ABNORMAL LOW (ref 1.7–7.7)
Neutrophils Relative %: 38 %
Platelets: 276 10*3/uL (ref 150–400)
RBC: 4.97 MIL/uL (ref 4.22–5.81)
RDW: 12.4 % (ref 11.5–15.5)
WBC: 3.8 10*3/uL — ABNORMAL LOW (ref 4.0–10.5)
nRBC: 0 % (ref 0.0–0.2)

## 2019-09-29 LAB — PROTIME-INR
INR: 1 (ref 0.8–1.2)
Prothrombin Time: 13.2 seconds (ref 11.4–15.2)

## 2019-09-29 LAB — SURGICAL PCR SCREEN
MRSA, PCR: NEGATIVE
Staphylococcus aureus: NEGATIVE

## 2019-09-29 LAB — SARS CORONAVIRUS 2 (TAT 6-24 HRS): SARS Coronavirus 2: NEGATIVE

## 2019-09-29 LAB — PREALBUMIN: Prealbumin: 23.5 mg/dL (ref 18–38)

## 2019-09-29 NOTE — Progress Notes (Signed)
Mr. Toruno will be completing his TB treatment on 10/05/2019.  Jocelyne Laveda Norman Swahili Interpreter started the appointment then had to leave, so I used [employer] Interpretering Caryn Bee ID# [phone].

## 2019-10-02 ENCOUNTER — Ambulatory Visit (HOSPITAL_COMMUNITY): Admission: RE | Admit: 2019-10-02 | Payer: Medicaid Other | Source: Home / Self Care | Admitting: Orthopaedic Surgery

## 2019-10-02 ENCOUNTER — Encounter (HOSPITAL_COMMUNITY): Admission: RE | Payer: Self-pay | Source: Home / Self Care

## 2019-10-02 LAB — TYPE AND SCREEN
ABO/RH(D): O POS
Antibody Screen: NEGATIVE

## 2019-10-02 SURGERY — ARTHROPLASTY, HIP, TOTAL, ANTERIOR APPROACH
Anesthesia: Spinal | Site: Hip | Laterality: Left

## 2019-10-17 ENCOUNTER — Inpatient Hospital Stay: Payer: Medicaid Other | Admitting: Orthopaedic Surgery

## 2019-10-17 ENCOUNTER — Ambulatory Visit (INDEPENDENT_AMBULATORY_CARE_PROVIDER_SITE_OTHER): Payer: Medicaid Other | Admitting: Orthopaedic Surgery

## 2019-10-17 ENCOUNTER — Encounter: Payer: Self-pay | Admitting: Orthopaedic Surgery

## 2019-10-17 VITALS — Ht 63.0 in | Wt 140.0 lb

## 2019-10-17 DIAGNOSIS — M21852 Other specified acquired deformities of left thigh: Secondary | ICD-10-CM | POA: Diagnosis not present

## 2019-10-17 NOTE — Progress Notes (Signed)
   Office Visit Note   Patient: Jerry Patel           Date of Birth: 1975/05/13           MRN: [id number] Visit Date: 10/17/2019              Requested by: [name], MD [address] [place],  [place] [address] PCP: [name], MD   Assessment & Plan: Visit Diagnoses:  1. 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  We will reschedule his surgery for first week of October.  He understands that guidelines have changed at the hospital and therefore this will need to be outpatient surgery which he is agreeable to.  Questions encouraged and answered. Total face to face encounter time was greater than 25 minutes and over half of this time was spent in counseling and/or coordination of care.  Follow-Up Instructions: Return for 2 week postop visit.   Orders:  No orders of the defined types were placed in this encounter.  No orders of the defined types were placed in this encounter.     Procedures: No procedures performed   Clinical Data: No additional findings.   Subjective: Chief Complaint  Patient presents with  . Left Hip - Pain    Patient returns today for further discussion of his left hip dysplasia and DJD.  He has approximately 3 more weeks treatment for his latent TB.  He is feeling well reports no respiratory symptoms.  His left hip still causing him severe pain and dysfunction.   Review of Systems   Objective: Vital Signs: Ht 5\' 3"  (1.6 m)   Wt 140 lb (63.5 kg)   BMI 24.80 kg/m   Physical Exam  Ortho Exam Left hip exam is unchanged. Specialty Comments:  No specialty comments available.  Imaging: No results found.   PMFS History: Patient Active Problem List   Diagnosis Date Noted  . 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 Latent tuberculosis 07/25/2019  . Hypertriglyceridemia 07/25/2019  . Refugee health examination 07/04/2019  . Essential  hypertension 07/04/2019  . Acquired dysplasia of hip, left 07/04/2019   Past Medical History:  Diagnosis Date  . Arthritis   . Hip dysplasia, acquired, left   . Hypertension   . Hypertriglyceridemia   . Latent tuberculosis     History reviewed. No pertinent family history.  History reviewed. No pertinent surgical history. Social History   Occupational History  . Not on file  Tobacco Use  . Smoking status: Never Smoker  . Smokeless tobacco: Never Used  Substance and Sexual Activity  . Alcohol use: Yes    Alcohol/week: 2.0 standard drinks    Types: 2 Cans of beer per week  . Drug use: Never  . Sexual activity: Yes    Partners: Female    Comment: Wife- [name]- in 07/06/2019

## 2019-11-03 ENCOUNTER — Ambulatory Visit: Payer: Medicaid Other | Admitting: Family Medicine

## 2019-11-03 ENCOUNTER — Encounter: Payer: Self-pay | Admitting: Family Medicine

## 2019-11-03 ENCOUNTER — Other Ambulatory Visit: Payer: Self-pay

## 2019-11-03 VITALS — BP 112/60 | HR 57 | Ht 62.6 in | Wt 137.8 lb

## 2019-11-03 DIAGNOSIS — E781 Pure hyperglyceridemia: Secondary | ICD-10-CM | POA: Diagnosis not present

## 2019-11-03 DIAGNOSIS — D7219 Other eosinophilia: Secondary | ICD-10-CM | POA: Diagnosis present

## 2019-11-03 DIAGNOSIS — I10 Essential (primary) hypertension: Secondary | ICD-10-CM

## 2019-11-03 DIAGNOSIS — M21852 Other specified acquired deformities of left thigh: Secondary | ICD-10-CM | POA: Diagnosis not present

## 2019-11-03 DIAGNOSIS — Z23 Encounter for immunization: Secondary | ICD-10-CM | POA: Diagnosis not present

## 2019-11-03 NOTE — Assessment & Plan Note (Signed)
At goal, continue HCTZ/Losartan/Amlodipine. No side effects Repeat creatinine/K today

## 2019-11-03 NOTE — Assessment & Plan Note (Signed)
Medically optimized for surgery. He should hold diuretic on day of surgery. He is low risk for a moderate risk procedure.

## 2019-11-03 NOTE — Assessment & Plan Note (Signed)
Repeat at follow up in spring 2022---discussed dietary changes

## 2019-11-03 NOTE — Progress Notes (Signed)
    SUBJECTIVE:   CHIEF COMPLAINT / HPI:   The patient speaks Swahili as their primary language.  An interpreter was used for the entire visit.   Jerry Patel is a pleasant 44 year old with history of left hip dysplasia, eosinophilia, Strongyloides (Treated), and latent Tb (treated) presenting for follow up.  He is living with a roommate. He is hopeful his family can come to the [place] soon. He denies low mood or poor sleep. He is working at night.   The patient reports compliance with antihypertensives. No chest pain, headaches, dyspnea, LE edema. No dizziness.  The patient recently has blood work completed which demonstrated elevated bilirubin. No EtOH use, no history of jaundice. Denies abdominal pain.   PERTINENT  PMH / PSH/Family/Social History : Updated and reviewed   OBJECTIVE:   BP 112/60   Pulse (!) 57   Ht 5' 2.6" (1.59 m)   Wt 137 lb 12.8 oz (62.5 kg)   SpO2 99%   BMI 24.72 kg/m   Cardiac: Regular rate and rhythm. Normal S1/S2. No murmurs, rubs, or gallops appreciated. Lungs: Clear bilaterally to ascultation.  Abdomen: Normoactive bowel sounds. No tenderness to deep or light palpation. No rebound or guarding. 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 Repeat CBC with differential today.   Acquired dysplasia of hip, left Medically optimized for surgery. He should hold diuretic on day of surgery. He is low risk for a moderate risk procedure.   Essential hypertension At goal, continue HCTZ/Losartan/Amlodipine. No side effects Repeat creatinine/K today    Hypertriglyceridemia Repeat at follow up in spring 2022---discussed dietary changes     HCM Flu given today.   At follow up- repeat lipids and ensure completed Tb follow up    Terisa Starr, MD  Family Medicine Teaching Service  [facility]

## 2019-11-03 NOTE — Patient Instructions (Addendum)
It was wonderful to see you today.  Please bring ALL of your medications with you to every visit.   Today we talked about:  - Your flu shot   - Getting blood work    3 months after your surgery    Thank you for choosing [facility].   Please call [phone] with any questions about today's appointment.  Please be sure to schedule follow up at the front  desk before you leave today.   Terisa Starr, MD  Family Medicine

## 2019-11-03 NOTE — Assessment & Plan Note (Signed)
Spleen of normal size. Repeat CBC with differential today.

## 2019-11-04 ENCOUNTER — Encounter: Payer: Self-pay | Admitting: Family Medicine

## 2019-11-04 LAB — CBC WITH DIFFERENTIAL/PLATELET
Basophils Absolute: 0 10*3/uL (ref 0.0–0.2)
Basos: 0 %
EOS (ABSOLUTE): 0.2 10*3/uL (ref 0.0–0.4)
Eos: 3 %
Hematocrit: 41.1 % (ref 37.5–51.0)
Hemoglobin: 13.7 g/dL (ref 13.0–17.7)
Immature Grans (Abs): 0 10*3/uL (ref 0.0–0.1)
Immature Granulocytes: 0 %
Lymphocytes Absolute: 2 10*3/uL (ref 0.7–3.1)
Lymphs: 41 %
MCH: 28 pg (ref 26.6–33.0)
MCHC: 33.3 g/dL (ref 31.5–35.7)
MCV: 84 fL (ref 79–97)
Monocytes Absolute: 0.5 10*3/uL (ref 0.1–0.9)
Monocytes: 11 %
Neutrophils Absolute: 2.1 10*3/uL (ref 1.4–7.0)
Neutrophils: 45 %
Platelets: 281 10*3/uL (ref 150–450)
RBC: 4.9 x10E6/uL (ref 4.14–5.80)
RDW: 12 % (ref 11.6–15.4)
WBC: 4.8 10*3/uL (ref 3.4–10.8)

## 2019-11-04 LAB — HEPATIC FUNCTION PANEL
ALT: 13 IU/L (ref 0–44)
AST: 23 IU/L (ref 0–40)
Albumin: 4.8 g/dL (ref 4.0–5.0)
Alkaline Phosphatase: 64 IU/L (ref 44–121)
Bilirubin Total: 0.5 mg/dL (ref 0.0–1.2)
Bilirubin, Direct: 0.2 mg/dL (ref 0.00–0.40)
Total Protein: 7.3 g/dL (ref 6.0–8.5)

## 2019-11-04 LAB — BASIC METABOLIC PANEL
BUN/Creatinine Ratio: 12 (ref 9–20)
BUN: 12 mg/dL (ref 6–24)
CO2: 26 mmol/L (ref 20–29)
Calcium: 9.4 mg/dL (ref 8.7–10.2)
Chloride: 100 mmol/L (ref 96–106)
Creatinine, Ser: 0.97 mg/dL (ref 0.76–1.27)
GFR calc Af Amer: 111 mL/min/{1.73_m2} (ref >59)
GFR calc non Af Amer: 96 mL/min/{1.73_m2} (ref >59)
Glucose: 93 mg/dL (ref 65–99)
Potassium: 4 mmol/L (ref 3.5–5.2)
Sodium: 141 mmol/L (ref 134–144)

## 2019-11-15 NOTE — Progress Notes (Signed)
[id number] [place], [place] - [address] AT [facility] [address] & [facility] [address] [place] [address] Phone: [phone] Fax: [phone]      Your procedure is scheduled on 11/20/19.  Report to [facility] Main Entrance "A" at 10:30 A.M., and check in at the Admitting office.  Call this number if you have problems the morning of surgery:  [phone]  Call [phone] if you have any questions prior to your surgery date Monday-Friday 8am-4pm    Remember:  Do not eat after midnight the night before your surgery  You may drink clear liquids until 9:30 the morning of your surgery.   Clear liquids allowed are: Water, Non-Citrus Juices (without pulp), Carbonated Beverages, Clear Tea, Black Coffee Only, and Gatorade  Please complete your PRE-SURGERY ENSURE that was provided to you by 9:30 the morning of surgery.  Please, if able, drink it in one setting. DO NOT SIP.    Take these medicines the morning of surgery with A SIP OF WATER: amLODipine (NORVASC)   As of today, STOP taking any Aspirin (unless otherwise instructed by your surgeon) Aleve, Naproxen, Ibuprofen, Motrin, Advil, Goody's, BC's, all herbal medications, fish oil, and all vitamins.                      Do not wear jewelry            Do not wear lotions, powders, colognes, or deodorant.            Do not shave 48 hours prior to surgery.  Men may shave face and neck.            Do not bring valuables to the hospital.            [facility] is not responsible for any belongings or valuables.  Do NOT Smoke (Tobacco/Vaping) or drink Alcohol 24 hours prior to your procedure If you use a CPAP at night, you may bring all equipment for your overnight stay.   Contacts, glasses, dentures or bridgework may not be worn into surgery.      For patients admitted to the hospital, discharge time will be determined by your treatment team.   Patients discharged the day of surgery will not be allowed  to drive home, and someone needs to stay with them for 24 hours.    Special instructions:   [place]- Preparing For Surgery  Before surgery, you can play an important role. Because skin is not sterile, your skin needs to be as free of germs as possible. You can reduce the number of germs on your skin by washing with CHG (chlorahexidine gluconate) Soap before surgery.  CHG is an antiseptic cleaner which kills germs and bonds with the skin to continue killing germs even after washing.    Oral Hygiene is also important to reduce your risk of infection.  Remember - BRUSH YOUR TEETH THE MORNING OF SURGERY WITH YOUR REGULAR TOOTHPASTE  Please do not use if you have an allergy to CHG or antibacterial soaps. If your skin becomes reddened/irritated stop using the CHG.  Do not shave (including legs and underarms) for at least 48 hours prior to first CHG shower. It is OK to shave your face.  Please follow these instructions carefully.   1. Shower the NIGHT BEFORE SURGERY and the MORNING OF SURGERY with CHG Soap.   2. If you chose to wash your hair, wash your hair  first as usual with your normal shampoo.  3. After you shampoo, rinse your hair and body thoroughly to remove the shampoo.  4. Use CHG as you would any other liquid soap. You can apply CHG directly to the skin and wash gently with a scrungie or a clean washcloth.   5. Apply the CHG Soap to your body ONLY FROM THE NECK DOWN.  Do not use on open wounds or open sores. Avoid contact with your eyes, ears, mouth and genitals (private parts). Wash Face and genitals (private parts)  with your normal soap.   6. Wash thoroughly, paying special attention to the area where your surgery will be performed.  7. Thoroughly rinse your body with warm water from the neck down.  8. DO NOT shower/wash with your normal soap after using and rinsing off the CHG Soap.  9. Pat yourself dry with a CLEAN TOWEL.  10. Wear CLEAN PAJAMAS to bed the night before  surgery  11. Place CLEAN SHEETS on your bed the night of your first shower and DO NOT SLEEP WITH PETS.   Day of Surgery: Wear Clean/Comfortable clothing the morning of surgery Do not apply any deodorants/lotions.   Remember to brush your teeth WITH YOUR REGULAR TOOTHPASTE.   Please read over the following fact sheets that you were given.

## 2019-11-16 ENCOUNTER — Encounter (HOSPITAL_COMMUNITY): Payer: Self-pay

## 2019-11-16 ENCOUNTER — Other Ambulatory Visit (HOSPITAL_COMMUNITY)
Admission: RE | Admit: 2019-11-16 | Discharge: 2019-11-16 | Disposition: A | Discharge status: Home / Self Care | Payer: Medicaid Other | Source: Ambulatory Visit | Attending: Orthopaedic Surgery | Admitting: Orthopaedic Surgery

## 2019-11-16 ENCOUNTER — Encounter (HOSPITAL_COMMUNITY)
Admission: RE | Admit: 2019-11-16 | Discharge: 2019-11-16 | Disposition: A | Discharge status: Home / Self Care | Payer: Medicaid Other | Source: Ambulatory Visit | Attending: Orthopaedic Surgery | Admitting: Orthopaedic Surgery

## 2019-11-16 ENCOUNTER — Other Ambulatory Visit: Payer: Self-pay

## 2019-11-16 ENCOUNTER — Ambulatory Visit (HOSPITAL_COMMUNITY)
Admission: RE | Admit: 2019-11-16 | Discharge: 2019-11-16 | Disposition: A | Discharge status: Home / Self Care | Payer: Medicaid Other | Source: Ambulatory Visit | Attending: Physician Assistant | Admitting: Physician Assistant

## 2019-11-16 DIAGNOSIS — Z20822 Contact with and (suspected) exposure to covid-19: Secondary | ICD-10-CM | POA: Diagnosis not present

## 2019-11-16 DIAGNOSIS — M1612 Unilateral primary osteoarthritis, left hip: Secondary | ICD-10-CM | POA: Insufficient documentation

## 2019-11-16 DIAGNOSIS — Z8611 Personal history of tuberculosis: Secondary | ICD-10-CM | POA: Insufficient documentation

## 2019-11-16 DIAGNOSIS — Z01818 Encounter for other preprocedural examination: Secondary | ICD-10-CM | POA: Diagnosis not present

## 2019-11-16 DIAGNOSIS — Z79899 Other long term (current) drug therapy: Secondary | ICD-10-CM | POA: Insufficient documentation

## 2019-11-16 DIAGNOSIS — I10 Essential (primary) hypertension: Secondary | ICD-10-CM | POA: Diagnosis not present

## 2019-11-16 LAB — COMPREHENSIVE METABOLIC PANEL
ALT: 20 U/L (ref 0–44)
AST: 25 U/L (ref 15–41)
Albumin: 4 g/dL (ref 3.5–5.0)
Alkaline Phosphatase: 48 U/L (ref 38–126)
Anion gap: 8 (ref 5–15)
BUN: 9 mg/dL (ref 6–20)
CO2: 31 mmol/L (ref 22–32)
Calcium: 9 mg/dL (ref 8.9–10.3)
Chloride: 102 mmol/L (ref 98–111)
Creatinine, Ser: 1.06 mg/dL (ref 0.61–1.24)
GFR calc non Af Amer: >60 mL/min (ref >60)
Glucose, Bld: 95 mg/dL (ref 70–99)
Potassium: 3.9 mmol/L (ref 3.5–5.1)
Sodium: 141 mmol/L (ref 135–145)
Total Bilirubin: 0.5 mg/dL (ref 0.3–1.2)
Total Protein: 7 g/dL (ref 6.5–8.1)

## 2019-11-16 LAB — CBC
HCT: 43.4 % (ref 39.0–52.0)
Hemoglobin: 14.1 g/dL (ref 13.0–17.0)
MCH: 27.8 pg (ref 26.0–34.0)
MCHC: 32.5 g/dL (ref 30.0–36.0)
MCV: 85.4 fL (ref 80.0–100.0)
Platelets: 256 10*3/uL (ref 150–400)
RBC: 5.08 MIL/uL (ref 4.22–5.81)
RDW: 11.3 % — ABNORMAL LOW (ref 11.5–15.5)
WBC: 4.8 10*3/uL (ref 4.0–10.5)
nRBC: 0 % (ref 0.0–0.2)

## 2019-11-16 LAB — TYPE AND SCREEN
ABO/RH(D): O POS
Antibody Screen: NEGATIVE

## 2019-11-16 LAB — SARS CORONAVIRUS 2 (TAT 6-24 HRS): SARS Coronavirus 2: NEGATIVE

## 2019-11-16 LAB — SURGICAL PCR SCREEN
MRSA, PCR: NEGATIVE
Staphylococcus aureus: NEGATIVE

## 2019-11-16 NOTE — Progress Notes (Signed)
PCP: Dr. 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 Denies any TB symptoms.  Recheck BP 168/103, Pulse 58--has not had morning BP meds yet, instructed to take as soon as possible. Daniel Nones with anesthesia notified, does not need to see in appt but requests CBC and CMET per anesthesia order set. T&S re-obtained per previous surgeon orders.  ERAS Ensure drink provided. Patient denies shortness of breath, fever, cough, and chest pain at PAT appointment.  Patient verbalized understanding of instructions provided today at the PAT appointment.  Patient asked to review instructions at home and day of surgery.   Engineer, building services used during appt-- Lodema Pilot.  Interpreter requested by scheduler for DOS.

## 2019-11-17 NOTE — Progress Notes (Signed)
Anesthesia Chart Review:  History of latent TB with positive QuantiFERON gold.  Completed treatment September 2021.  Last seen by PCP Dr. Manson Passey on November 03, 2019.  Per note, "Medically optimized for surgery. He should hold diuretic on day of surgery. He is low risk for a moderate risk procedure."  BP elevated at PAT appointment, 160/105.  Patient had not yet taken his medications.  Reviewed importance of taking all medications as prescribed.  Understands that significantly uncontrolled hypertension on day of surgery could cause for cancellation.  Preop labs reviewed, WNL.  EKG 09/29/19: Normal sinus rhythm.  Rate 61. Moderate voltage criteria for LVH, may be normal variant  CHEST - 2 VIEW 11/16/19: COMPARISON:  PA and lateral chest 07/06/2019.  FINDINGS: Lungs clear. Heart size normal. No pneumothorax or pleural fluid. No acute or focal bony abnormality.  IMPRESSION: Negative chest.   [facility] Short Stay Center/Anesthesiology Phone [phone] 11/17/2019 9:49 AM

## 2019-11-17 NOTE — Anesthesia Preprocedure Evaluation (Addendum)
Anesthesia Evaluation  Patient identified by MRN, date of birth, ID band Patient awake    Reviewed: Allergy & Precautions, H&P , NPO status , Patient's Chart, lab work & pertinent test results  Airway Mallampati: II  TM Distance: >3 FB Neck ROM: Full    Dental no notable dental hx. (+) Teeth Intact, Dental Advisory Given   Pulmonary neg pulmonary ROS,    Pulmonary exam normal breath sounds clear to auscultation       Cardiovascular hypertension, Pt. on medications  Rhythm:Regular Rate:Normal     Neuro/Psych negative neurological ROS  negative psych ROS   GI/Hepatic negative GI ROS, Neg liver ROS,   Endo/Other  negative endocrine ROS  Renal/GU negative Renal ROS  negative genitourinary   Musculoskeletal  (+) Arthritis , Osteoarthritis,    Abdominal   Peds  Hematology negative hematology ROS (+)   Anesthesia Other Findings   Reproductive/Obstetrics negative OB ROS                           Anesthesia Physical Anesthesia Plan  ASA: II  Anesthesia Plan: MAC and Spinal   Post-op Pain Management:    Induction: Intravenous  PONV Risk Score and Plan: 2 and Ondansetron, Propofol infusion and Midazolam  Airway Management Planned: Simple Face Mask  Additional Equipment:   Intra-op Plan:   Post-operative Plan:   Informed Consent: I have reviewed the patients History and Physical, chart, labs and discussed the procedure including the risks, benefits and alternatives for the proposed anesthesia with the patient or authorized representative who has indicated his/her understanding and acceptance.     Dental advisory given  Plan Discussed with: CRNA  Anesthesia Plan Comments: (PAT note by Karoline Caldwell, PA-C: History of latent TB with positive QuantiFERON gold.  Completed treatment September 2021.  Last seen by PCP Dr. Owens Shark on November 03, 2019.  Per note, "Medically optimized for  surgery. He should hold diuretic on day of surgery. He is low risk for a moderate risk procedure."  BP elevated at PAT appointment, 160/105.  Patient had not yet taken his medications.  Reviewed importance of taking all medications as prescribed.  Understands that significantly uncontrolled hypertension on day of surgery could cause for cancellation.  Preop labs reviewed, WNL.  EKG 09/29/19: Normal sinus rhythm.  Rate 61. Moderate voltage criteria for LVH, may be normal variant  CHEST - 2 VIEW 11/16/19: COMPARISON:  PA and lateral chest 07/06/2019.  FINDINGS: Lungs clear. Heart size normal. No pneumothorax or pleural fluid. No acute or focal bony abnormality.  IMPRESSION: Negative chest. )       Anesthesia Quick Evaluation

## 2019-11-20 ENCOUNTER — Observation Stay (HOSPITAL_COMMUNITY)
Admission: RE | Admit: 2019-11-20 | Discharge: 2019-11-21 | Disposition: A | Discharge status: Home / Self Care | Payer: Medicaid Other | Attending: Orthopaedic Surgery | Admitting: Orthopaedic Surgery

## 2019-11-20 ENCOUNTER — Encounter (HOSPITAL_COMMUNITY)
Admission: RE | Disposition: A | Discharge status: Home / Self Care | Payer: Self-pay | Source: Home / Self Care | Attending: Orthopaedic Surgery

## 2019-11-20 ENCOUNTER — Other Ambulatory Visit: Payer: Self-pay

## 2019-11-20 ENCOUNTER — Ambulatory Visit (HOSPITAL_COMMUNITY): Payer: Medicaid Other | Admitting: Anesthesiology

## 2019-11-20 ENCOUNTER — Encounter (HOSPITAL_COMMUNITY): Payer: Self-pay | Admitting: Orthopaedic Surgery

## 2019-11-20 ENCOUNTER — Ambulatory Visit (HOSPITAL_COMMUNITY): Payer: Medicaid Other

## 2019-11-20 ENCOUNTER — Other Ambulatory Visit: Payer: Self-pay | Admitting: Physician Assistant

## 2019-11-20 ENCOUNTER — Ambulatory Visit (HOSPITAL_COMMUNITY): Payer: Medicaid Other | Admitting: Physician Assistant

## 2019-11-20 ENCOUNTER — Observation Stay (HOSPITAL_COMMUNITY): Payer: Medicaid Other

## 2019-11-20 DIAGNOSIS — Z96642 Presence of left artificial hip joint: Secondary | ICD-10-CM

## 2019-11-20 DIAGNOSIS — Z79899 Other long term (current) drug therapy: Secondary | ICD-10-CM | POA: Insufficient documentation

## 2019-11-20 DIAGNOSIS — Z96649 Presence of unspecified artificial hip joint: Secondary | ICD-10-CM

## 2019-11-20 DIAGNOSIS — Z419 Encounter for procedure for purposes other than remedying health state, unspecified: Secondary | ICD-10-CM

## 2019-11-20 DIAGNOSIS — I10 Essential (primary) hypertension: Secondary | ICD-10-CM | POA: Diagnosis not present

## 2019-11-20 DIAGNOSIS — M21852 Other specified acquired deformities of left thigh: Secondary | ICD-10-CM

## 2019-11-20 DIAGNOSIS — M1632 Unilateral osteoarthritis resulting from hip dysplasia, left hip: Secondary | ICD-10-CM | POA: Diagnosis not present

## 2019-11-20 HISTORY — PX: TOTAL HIP ARTHROPLASTY: SHX124

## 2019-11-20 SURGERY — ARTHROPLASTY, HIP, TOTAL, ANTERIOR APPROACH
Anesthesia: Monitor Anesthesia Care | Site: Hip | Laterality: Left

## 2019-11-20 MED ORDER — METHOCARBAMOL 1000 MG/10ML IJ SOLN
500.0000 mg | Freq: Four times a day (QID) | INTRAVENOUS | Status: DC | PRN
  Filled 2019-11-20: qty 5

## 2019-11-20 MED ORDER — METOCLOPRAMIDE HCL 5 MG/ML IJ SOLN: 5.0000 mg | Freq: Three times a day (TID) | INTRAMUSCULAR | Status: DC | PRN

## 2019-11-20 MED ORDER — BUPIVACAINE-EPINEPHRINE 0.25% -1:200000 IJ SOLN
INTRAMUSCULAR | Status: DC | PRN
  Administered 2019-11-20: 20 mL

## 2019-11-20 MED ORDER — HYDROMORPHONE HCL 1 MG/ML IJ SOLN: 0.5000 mg | INTRAMUSCULAR | Status: DC | PRN

## 2019-11-20 MED ORDER — TRANEXAMIC ACID 1000 MG/10ML IV SOLN
INTRAVENOUS | Status: DC | PRN
  Administered 2019-11-20: 2000 mg via TOPICAL

## 2019-11-20 MED ORDER — ASPIRIN EC 81 MG PO TBEC: 81.0000 mg | DELAYED_RELEASE_TABLET | Freq: Two times a day (BID) | ORAL | 0 refills | Status: AC

## 2019-11-20 MED ORDER — BUPIVACAINE LIPOSOME 1.3 % IJ SUSP
20.0000 mL | Freq: Once | INTRAMUSCULAR | Status: DC
  Filled 2019-11-20: qty 20

## 2019-11-20 MED ORDER — KETOROLAC TROMETHAMINE 15 MG/ML IJ SOLN
15.0000 mg | Freq: Four times a day (QID) | INTRAMUSCULAR | Status: DC
  Administered 2019-11-20 – 2019-11-21 (×3): 15 mg via INTRAVENOUS
  Filled 2019-11-20 (×3): qty 1

## 2019-11-20 MED ORDER — ONDANSETRON HCL 4 MG/2ML IJ SOLN
INTRAMUSCULAR | Status: AC
  Filled 2019-11-20: qty 2

## 2019-11-20 MED ORDER — AMLODIPINE BESYLATE 5 MG PO TABS
10.0000 mg | ORAL_TABLET | Freq: Every day | ORAL | Status: DC
  Administered 2019-11-20: 10 mg via ORAL
  Filled 2019-11-20: qty 2

## 2019-11-20 MED ORDER — MENTHOL 3 MG MT LOZG: 1.0000 | LOZENGE | OROMUCOSAL | Status: DC | PRN

## 2019-11-20 MED ORDER — SODIUM CHLORIDE 0.9% FLUSH
INTRAVENOUS | Status: DC | PRN
  Administered 2019-11-20: 20 mL

## 2019-11-20 MED ORDER — DOCUSATE SODIUM 100 MG PO CAPS: 100.0000 mg | ORAL_CAPSULE | Freq: Every day | ORAL | 2 refills | Status: AC | PRN

## 2019-11-20 MED ORDER — LOSARTAN POTASSIUM-HCTZ 50-12.5 MG PO TABS: 2.0000 | ORAL_TABLET | Freq: Every day | ORAL | Status: DC

## 2019-11-20 MED ORDER — ONDANSETRON HCL 4 MG/2ML IJ SOLN: 4.0000 mg | Freq: Four times a day (QID) | INTRAMUSCULAR | Status: DC | PRN

## 2019-11-20 MED ORDER — PHENYLEPHRINE HCL-NACL 10-0.9 MG/250ML-% IV SOLN
INTRAVENOUS | Status: DC | PRN
  Administered 2019-11-20: 25 ug/min via INTRAVENOUS

## 2019-11-20 MED ORDER — POVIDONE-IODINE 10 % EX SWAB: 2.0000 "application " | Freq: Once | CUTANEOUS | Status: DC

## 2019-11-20 MED ORDER — ORAL CARE MOUTH RINSE: 15.0000 mL | Freq: Once | OROMUCOSAL | Status: AC

## 2019-11-20 MED ORDER — CHLORHEXIDINE GLUCONATE 0.12 % MT SOLN
15.0000 mL | Freq: Once | OROMUCOSAL | Status: AC
  Administered 2019-11-20: 15 mL via OROMUCOSAL
  Filled 2019-11-20: qty 15

## 2019-11-20 MED ORDER — METHOCARBAMOL 500 MG PO TABS: 500.0000 mg | ORAL_TABLET | Freq: Two times a day (BID) | ORAL | 0 refills | Status: AC | PRN

## 2019-11-20 MED ORDER — LACTATED RINGERS IV SOLN: INTRAVENOUS | Status: DC

## 2019-11-20 MED ORDER — ASPIRIN 81 MG PO CHEW
81.0000 mg | CHEWABLE_TABLET | Freq: Two times a day (BID) | ORAL | Status: DC
  Administered 2019-11-20 – 2019-11-21 (×2): 81 mg via ORAL
  Filled 2019-11-20 (×2): qty 1

## 2019-11-20 MED ORDER — BUPIVACAINE-EPINEPHRINE (PF) 0.25% -1:200000 IJ SOLN
INTRAMUSCULAR | Status: AC
  Filled 2019-11-20: qty 20

## 2019-11-20 MED ORDER — TRANEXAMIC ACID-NACL 1000-0.7 MG/100ML-% IV SOLN
1000.0000 mg | Freq: Once | INTRAVENOUS | Status: AC
  Administered 2019-11-20: 1000 mg via INTRAVENOUS
  Filled 2019-11-20: qty 100

## 2019-11-20 MED ORDER — MAGNESIUM CITRATE PO SOLN: 1.0000 | Freq: Once | ORAL | Status: DC | PRN

## 2019-11-20 MED ORDER — LIDOCAINE 2% (20 MG/ML) 5 ML SYRINGE
INTRAMUSCULAR | Status: AC
  Filled 2019-11-20: qty 5

## 2019-11-20 MED ORDER — 0.9 % SODIUM CHLORIDE (POUR BTL) OPTIME
TOPICAL | Status: DC | PRN
  Administered 2019-11-20: 1000 mL

## 2019-11-20 MED ORDER — ONDANSETRON HCL 4 MG PO TABS: 4.0000 mg | ORAL_TABLET | Freq: Four times a day (QID) | ORAL | Status: DC | PRN

## 2019-11-20 MED ORDER — BUPIVACAINE IN DEXTROSE 0.75-8.25 % IT SOLN
INTRATHECAL | Status: DC | PRN
  Administered 2019-11-20: 1.6 mL via INTRATHECAL

## 2019-11-20 MED ORDER — ONDANSETRON HCL 4 MG PO TABS: 4.0000 mg | ORAL_TABLET | Freq: Three times a day (TID) | ORAL | 0 refills | Status: AC | PRN

## 2019-11-20 MED ORDER — FENTANYL CITRATE (PF) 250 MCG/5ML IJ SOLN
INTRAMUSCULAR | Status: DC | PRN
Start: 2019-11-20 — End: 2019-11-20
  Administered 2019-11-20: 50 ug via INTRAVENOUS

## 2019-11-20 MED ORDER — PROPOFOL 10 MG/ML IV BOLUS
INTRAVENOUS | Status: DC | PRN
  Administered 2019-11-20: 20 mg via INTRAVENOUS

## 2019-11-20 MED ORDER — PROPOFOL 10 MG/ML IV BOLUS
INTRAVENOUS | Status: AC
  Filled 2019-11-20: qty 20

## 2019-11-20 MED ORDER — METHOCARBAMOL 500 MG PO TABS
500.0000 mg | ORAL_TABLET | Freq: Four times a day (QID) | ORAL | Status: DC | PRN
  Administered 2019-11-20: 500 mg via ORAL
  Filled 2019-11-20: qty 1

## 2019-11-20 MED ORDER — SODIUM CHLORIDE 0.9 % IR SOLN
Status: DC | PRN
  Administered 2019-11-20: 3000 mL

## 2019-11-20 MED ORDER — PROPOFOL 500 MG/50ML IV EMUL
INTRAVENOUS | Status: DC | PRN
  Administered 2019-11-20: 100 ug/kg/min via INTRAVENOUS

## 2019-11-20 MED ORDER — FENTANYL CITRATE (PF) 250 MCG/5ML IJ SOLN
INTRAMUSCULAR | Status: AC
  Filled 2019-11-20: qty 5

## 2019-11-20 MED ORDER — CEFAZOLIN SODIUM-DEXTROSE 2-4 GM/100ML-% IV SOLN
2.0000 g | Freq: Four times a day (QID) | INTRAVENOUS | Status: AC
  Administered 2019-11-20 – 2019-11-21 (×2): 2 g via INTRAVENOUS
  Filled 2019-11-20 (×2): qty 100

## 2019-11-20 MED ORDER — ALUM & MAG HYDROXIDE-SIMETH 200-200-20 MG/5ML PO SUSP: 30.0000 mL | ORAL | Status: DC | PRN

## 2019-11-20 MED ORDER — OXYCODONE HCL 5 MG PO TABS
5.0000 mg | ORAL_TABLET | ORAL | Status: DC | PRN
  Administered 2019-11-20: 5 mg via ORAL
  Administered 2019-11-20: 10 mg via ORAL
  Administered 2019-11-21: 5 mg via ORAL
  Filled 2019-11-20 (×2): qty 1
  Filled 2019-11-20: qty 2

## 2019-11-20 MED ORDER — OXYCODONE-ACETAMINOPHEN 5-325 MG PO TABS: 1.0000 | ORAL_TABLET | Freq: Four times a day (QID) | ORAL | 0 refills | Status: AC | PRN

## 2019-11-20 MED ORDER — DEXAMETHASONE SODIUM PHOSPHATE 10 MG/ML IJ SOLN
10.0000 mg | Freq: Once | INTRAMUSCULAR | Status: AC
  Administered 2019-11-21: 10 mg via INTRAVENOUS
  Filled 2019-11-20: qty 1

## 2019-11-20 MED ORDER — ACETAMINOPHEN 500 MG PO TABS
1000.0000 mg | ORAL_TABLET | Freq: Four times a day (QID) | ORAL | Status: AC
  Administered 2019-11-20 – 2019-11-21 (×4): 1000 mg via ORAL
  Filled 2019-11-20 (×4): qty 2

## 2019-11-20 MED ORDER — ACETAMINOPHEN 500 MG PO TABS
1000.0000 mg | ORAL_TABLET | Freq: Once | ORAL | Status: AC
  Administered 2019-11-20: 1000 mg via ORAL
  Filled 2019-11-20: qty 2

## 2019-11-20 MED ORDER — POLYETHYLENE GLYCOL 3350 17 G PO PACK: 17.0000 g | PACK | Freq: Every day | ORAL | Status: DC | PRN

## 2019-11-20 MED ORDER — SODIUM CHLORIDE 0.9 % IV SOLN: INTRAVENOUS | Status: DC

## 2019-11-20 MED ORDER — OXYCODONE HCL 5 MG PO TABS: 10.0000 mg | ORAL_TABLET | ORAL | Status: DC | PRN

## 2019-11-20 MED ORDER — DEXAMETHASONE SODIUM PHOSPHATE 10 MG/ML IJ SOLN
INTRAMUSCULAR | Status: AC
  Filled 2019-11-20: qty 1

## 2019-11-20 MED ORDER — VANCOMYCIN HCL 1000 MG IV SOLR
INTRAVENOUS | Status: AC
  Filled 2019-11-20: qty 1000

## 2019-11-20 MED ORDER — METOCLOPRAMIDE HCL 5 MG PO TABS: 5.0000 mg | ORAL_TABLET | Freq: Three times a day (TID) | ORAL | Status: DC | PRN

## 2019-11-20 MED ORDER — MIDAZOLAM HCL 2 MG/2ML IJ SOLN
INTRAMUSCULAR | Status: DC | PRN
  Administered 2019-11-20: 2 mg via INTRAVENOUS

## 2019-11-20 MED ORDER — TRANEXAMIC ACID-NACL 1000-0.7 MG/100ML-% IV SOLN
1000.0000 mg | INTRAVENOUS | Status: AC
  Administered 2019-11-20: 1000 mg via INTRAVENOUS
  Filled 2019-11-20: qty 100

## 2019-11-20 MED ORDER — HYDROMORPHONE HCL 1 MG/ML IJ SOLN: 0.2500 mg | INTRAMUSCULAR | Status: DC | PRN

## 2019-11-20 MED ORDER — CEFAZOLIN SODIUM-DEXTROSE 2-4 GM/100ML-% IV SOLN
INTRAVENOUS | Status: AC
  Filled 2019-11-20: qty 100

## 2019-11-20 MED ORDER — SORBITOL 70 % SOLN
30.0000 mL | Freq: Every day | Status: DC | PRN
  Filled 2019-11-20: qty 30

## 2019-11-20 MED ORDER — IRRISEPT - 450ML BOTTLE WITH 0.05% CHG IN STERILE WATER, USP 99.95% OPTIME
TOPICAL | Status: DC | PRN
  Administered 2019-11-20: 450 mL

## 2019-11-20 MED ORDER — SODIUM CHLORIDE 0.9 % IV SOLN
INTRAVENOUS | Status: DC | PRN
  Administered 2019-11-20: 40 mL

## 2019-11-20 MED ORDER — MIDAZOLAM HCL 2 MG/2ML IJ SOLN
INTRAMUSCULAR | Status: AC
  Filled 2019-11-20: qty 2

## 2019-11-20 MED ORDER — ONDANSETRON HCL 4 MG/2ML IJ SOLN
INTRAMUSCULAR | Status: DC | PRN
  Administered 2019-11-20: 4 mg via INTRAVENOUS

## 2019-11-20 MED ORDER — OXYCODONE HCL ER 10 MG PO T12A
10.0000 mg | EXTENDED_RELEASE_TABLET | Freq: Two times a day (BID) | ORAL | Status: DC
  Administered 2019-11-20 – 2019-11-21 (×2): 10 mg via ORAL
  Filled 2019-11-20 (×2): qty 1

## 2019-11-20 MED ORDER — DOCUSATE SODIUM 100 MG PO CAPS
100.0000 mg | ORAL_CAPSULE | Freq: Two times a day (BID) | ORAL | Status: DC
  Administered 2019-11-20 – 2019-11-21 (×2): 100 mg via ORAL
  Filled 2019-11-20 (×2): qty 1

## 2019-11-20 MED ORDER — PHENOL 1.4 % MT LIQD: 1.0000 | OROMUCOSAL | Status: DC | PRN

## 2019-11-20 MED ORDER — DIPHENHYDRAMINE HCL 12.5 MG/5ML PO ELIX
25.0000 mg | ORAL_SOLUTION | ORAL | Status: DC | PRN
  Filled 2019-11-20: qty 10

## 2019-11-20 MED ORDER — VANCOMYCIN HCL 1 G IV SOLR
INTRAVENOUS | Status: DC | PRN
  Administered 2019-11-20: 1000 mg via TOPICAL

## 2019-11-20 MED ORDER — LOSARTAN POTASSIUM 50 MG PO TABS
100.0000 mg | ORAL_TABLET | Freq: Every day | ORAL | Status: DC
  Administered 2019-11-20 – 2019-11-21 (×2): 100 mg via ORAL
  Filled 2019-11-20 (×2): qty 2

## 2019-11-20 MED ORDER — CEFAZOLIN SODIUM-DEXTROSE 2-4 GM/100ML-% IV SOLN
2.0000 g | INTRAVENOUS | Status: AC
  Administered 2019-11-20: 2 g via INTRAVENOUS
  Filled 2019-11-20: qty 100

## 2019-11-20 MED ORDER — ACETAMINOPHEN 325 MG PO TABS: 325.0000 mg | ORAL_TABLET | Freq: Four times a day (QID) | ORAL | Status: DC | PRN

## 2019-11-20 MED ORDER — TRANEXAMIC ACID 1000 MG/10ML IV SOLN
2000.0000 mg | INTRAVENOUS | Status: DC
  Filled 2019-11-20: qty 20

## 2019-11-20 MED ORDER — HYDROCHLOROTHIAZIDE 25 MG PO TABS
25.0000 mg | ORAL_TABLET | Freq: Every day | ORAL | Status: DC
  Administered 2019-11-20 – 2019-11-21 (×2): 25 mg via ORAL
  Filled 2019-11-20 (×2): qty 1

## 2019-11-20 SURGICAL SUPPLY — 65 items
BAG DECANTER FOR FLEXI CONT (MISCELLANEOUS) ×2 IMPLANT
CELLS DAT CNTRL 66122 CELL SVR (MISCELLANEOUS) IMPLANT
CLSR STERI-STRIP ANTIMIC 1/2X4 (GAUZE/BANDAGES/DRESSINGS) ×2 IMPLANT
COVER PERINEAL POST (MISCELLANEOUS) ×2 IMPLANT
COVER SURGICAL LIGHT HANDLE (MISCELLANEOUS) ×2 IMPLANT
COVER WAND RF STERILE (DRAPES) IMPLANT
CUP SECTOR GRIPTON 50MM (Cup) ×2 IMPLANT
DRAPE C-ARM 42X72 X-RAY (DRAPES) ×2 IMPLANT
DRAPE POUCH INSTRU U-SHP 10X18 (DRAPES) ×2 IMPLANT
DRAPE STERI IOBAN 125X83 (DRAPES) ×2 IMPLANT
DRAPE U-SHAPE 47X51 STRL (DRAPES) ×4 IMPLANT
DRSG AQUACEL AG ADV 3.5X10 (GAUZE/BANDAGES/DRESSINGS) ×2 IMPLANT
DURAPREP 26ML APPLICATOR (WOUND CARE) ×4 IMPLANT
ELECT BLADE 4.0 EZ CLEAN MEGAD (MISCELLANEOUS) ×2
ELECT CAUTERY BLADE 6.4 (BLADE) ×2 IMPLANT
ELECT REM PT RETURN 9FT ADLT (ELECTROSURGICAL) ×2
ELECTRODE BLDE 4.0 EZ CLN MEGD (MISCELLANEOUS) ×1 IMPLANT
ELECTRODE REM PT RTRN 9FT ADLT (ELECTROSURGICAL) ×1 IMPLANT
GLOVE BIOGEL PI IND STRL 7.0 (GLOVE) ×1 IMPLANT
GLOVE BIOGEL PI INDICATOR 7.0 (GLOVE) ×1
GLOVE ECLIPSE 7.0 STRL STRAW (GLOVE) ×4 IMPLANT
GLOVE SKINSENSE NS SZ7.5 (GLOVE) ×1
GLOVE SKINSENSE STRL SZ7.5 (GLOVE) ×1 IMPLANT
GLOVE SURG SYN 7.5  E (GLOVE) ×4
GLOVE SURG SYN 7.5 E (GLOVE) ×4 IMPLANT
GOWN STRL REIN XL XLG (GOWN DISPOSABLE) ×2 IMPLANT
GOWN STRL REUS W/ TWL LRG LVL3 (GOWN DISPOSABLE) IMPLANT
GOWN STRL REUS W/ TWL XL LVL3 (GOWN DISPOSABLE) ×1 IMPLANT
GOWN STRL REUS W/TWL LRG LVL3 (GOWN DISPOSABLE)
GOWN STRL REUS W/TWL XL LVL3 (GOWN DISPOSABLE) ×1
HANDPIECE INTERPULSE COAX TIP (DISPOSABLE) ×1
HEAD FEMORAL 32 CERAMIC (Hips) ×2 IMPLANT
HOOD PEEL AWAY FLYTE STAYCOOL (MISCELLANEOUS) ×4 IMPLANT
IV NS IRRIG 3000ML ARTHROMATIC (IV SOLUTION) ×2 IMPLANT
JET LAVAGE IRRISEPT WOUND (IRRIGATION / IRRIGATOR) ×2
KIT BASIN OR (CUSTOM PROCEDURE TRAY) ×2 IMPLANT
LAVAGE JET IRRISEPT WOUND (IRRIGATION / IRRIGATOR) ×1 IMPLANT
LINER ACET PNNCL PLUS4 NEUTRAL (Hips) ×1 IMPLANT
MARKER SKIN DUAL TIP RULER LAB (MISCELLANEOUS) ×2 IMPLANT
NEEDLE 18GX1X1/2 (RX/OR ONLY) (NEEDLE) ×2 IMPLANT
NEEDLE SPNL 18GX3.5 QUINCKE PK (NEEDLE) ×4 IMPLANT
PACK TOTAL JOINT (CUSTOM PROCEDURE TRAY) ×2 IMPLANT
PACK UNIVERSAL I (CUSTOM PROCEDURE TRAY) ×2 IMPLANT
PINNACLE PLUS 4 NEUTRAL (Hips) ×2 IMPLANT
RTRCTR WOUND ALEXIS 18CM MED (MISCELLANEOUS)
SAW OSC TIP CART 19.5X105X1.3 (SAW) ×2 IMPLANT
SCREW 6.5MMX25MM (Screw) ×2 IMPLANT
SET HNDPC FAN SPRY TIP SCT (DISPOSABLE) ×1 IMPLANT
STAPLER VISISTAT 35W (STAPLE) IMPLANT
STEM FEM ACTIS STD SZ2 (Stem) ×2 IMPLANT
SUT ETHIBOND 2 V 37 (SUTURE) ×2 IMPLANT
SUT MNCRL AB 3-0 PS2 18 (SUTURE) ×2 IMPLANT
SUT VIC AB 0 CT1 27 (SUTURE) ×1
SUT VIC AB 0 CT1 27XBRD ANBCTR (SUTURE) ×1 IMPLANT
SUT VIC AB 1 CTX 36 (SUTURE) ×2
SUT VIC AB 1 CTX36XBRD ANBCTR (SUTURE) ×2 IMPLANT
SUT VIC AB 2-0 CT1 27 (SUTURE) ×2
SUT VIC AB 2-0 CT1 TAPERPNT 27 (SUTURE) ×2 IMPLANT
SYR 30ML LL (SYRINGE) ×2 IMPLANT
SYR 50ML LL SCALE MARK (SYRINGE) ×2 IMPLANT
TOWEL GREEN STERILE (TOWEL DISPOSABLE) ×2 IMPLANT
TRAY CATH 16FR W/PLASTIC CATH (SET/KITS/TRAYS/PACK) IMPLANT
TRAY FOLEY W/BAG SLVR 16FR (SET/KITS/TRAYS/PACK)
TRAY FOLEY W/BAG SLVR 16FR ST (SET/KITS/TRAYS/PACK) IMPLANT
YANKAUER SUCT BULB TIP NO VENT (SUCTIONS) ×2 IMPLANT

## 2019-11-20 NOTE — Anesthesia Procedure Notes (Signed)
Spinal  Patient location during procedure: OR Start time: 11/20/2019 12:26 PM End time: 11/20/2019 12:31 PM Staffing Performed: anesthesiologist  Anesthesiologist: Gaynelle Adu, MD Preanesthetic Checklist Completed: patient identified, IV checked, risks and benefits discussed, surgical consent, monitors and equipment checked, pre-op evaluation and timeout performed Spinal Block Patient position: sitting Prep: DuraPrep Patient monitoring: cardiac monitor, continuous pulse ox and blood pressure Approach: midline Location: L3-4 Injection technique: single-shot Needle Needle type: Pencan  Needle gauge: 24 G Needle length: 9 cm Assessment Sensory level: T8 Additional Notes Functioning IV was confirmed and monitors were applied. Sterile prep and drape, including hand hygiene and sterile gloves were used. The patient was positioned and the spine was prepped. The skin was anesthetized with lidocaine.  Free flow of clear CSF was obtained prior to injecting local anesthetic into the CSF.  The spinal needle aspirated freely following injection.  The needle was carefully withdrawn.  The patient tolerated the procedure well.

## 2019-11-20 NOTE — H&P (Signed)
PREOPERATIVE H&P  Chief Complaint: left hip dysplasia, degenerative joint disease  HPI: Jerry Patel is a 44 y.o. male who presents for surgical treatment of left hip dysplasia, degenerative joint disease.  He denies any changes in medical history.  Past Medical History:  Diagnosis Date  . Arthritis   . Hip dysplasia, acquired, left   . Hypertension   . Hypertriglyceridemia   . Latent tuberculosis    Completed treatment 10/2019   No past surgical history on file. Social History   Socioeconomic History  . Marital status: Married    Spouse name: Not on file  . Number of children: Not on file  . Years of education: Not on file  . Highest education level: Not on file  Occupational History  . Not on file  Tobacco Use  . Smoking status: Never Smoker  . Smokeless tobacco: Never Used  Substance and Sexual Activity  . Alcohol use: Yes    Alcohol/week: 2.0 standard drinks    Types: 2 Cans of beer per week  . Drug use: Never  . Sexual activity: Yes    Partners: Female    Comment: Wife- [name]- in [place]   Other Topics Concern  . Not on file  Social History Narrative   Refugee Information   Number of Immediate Family Members: 6(Three children and 2 step children)   Number of Immediate Family Members in [place]: 0(Brother lives in [place])   Date of Arrival: 06/21/19   Country of Birth: [facility]   Country of Origin: ([place])   Location of Refugee [place]: [place]   Duration in [place]: 20 years or greater   Reason for Leaving Home Country: Oceanographer opinion   Primary Language: Swahili/Kiswahili, English, Other, French(Kibebeme, Mashi, Lingala)   Able to Read in Primary Language: Yes   Able to Write in Primary Language: Yes   Education: [employer]   Prior Work: Runner, broadcasting/film/video in [place]   Marital Status: Married([name] Programmer, systems)   Sexual Activity: Yes   Tuberculosis Screening Overseas: Negative   History of Trauma: Other(Witness to war in [facility])   Do You Feel Jumpy or Nervous?:  Yes   Are You Very Watchful or 'Super Alert'?: No   Social Determinants of Health   Financial Resource Strain:   . Difficulty of Paying Living Expenses: Not on file  Food Insecurity:   . Worried About Programme researcher, broadcasting/film/video in the Last Year: Not on file  . Ran Out of Food in the Last Year: Not on file  Transportation Needs:   . Lack of Transportation (Medical): Not on file  . Lack of Transportation (Non-Medical): Not on file  Physical Activity:   . Days of Exercise per Week: Not on file  . Minutes of Exercise per Session: Not on file  Stress:   . Feeling of Stress : Not on file  Social Connections:   . Frequency of Communication with Friends and Family: Not on file  . Frequency of Social Gatherings with Friends and Family: Not on file  . Attends Religious Services: Not on file  . Active Member of Clubs or Organizations: Not on file  . Attends Banker Meetings: Not on file  . Marital Status: Not on file   No family history on file. No Known Allergies Prior to Admission medications   Medication Sig Start Date End Date Taking? Authorizing Provider  amLODipine (NORVASC) 10 MG tablet Take 1 tablet (10 mg total) by mouth at bedtime. Patient taking differently: Take 10 mg  by mouth in the morning and at bedtime.  07/04/19  Yes [name], MD  losartan-hydrochlorothiazide (HYZAAR) 50-12.5 MG tablet Take 2 tablets by mouth daily. Patient taking differently: Take 2 tablets by mouth in the morning and at bedtime.  07/25/19  Yes Winfrey, Harlen Labs, MD     Positive ROS: All other systems have been reviewed and were otherwise negative with the exception of those mentioned in the HPI and as above.  Physical Exam: General: Alert, no acute distress Cardiovascular: No pedal edema Respiratory: No cyanosis, no use of accessory musculature GI: abdomen soft Skin: No lesions in the area of chief complaint Neurologic: Sensation intact distally Psychiatric: Patient is competent for  consent with normal mood and affect Lymphatic: no lymphedema  MUSCULOSKELETAL: exam stable  Assessment: left hip dysplasia, degenerative joint disease  Plan: Plan for Procedure(s): LEFT TOTAL HIP ARTHROPLASTY ANTERIOR APPROACH  The risks benefits and alternatives were discussed with the patient including but not limited to the risks of nonoperative treatment, versus surgical intervention including infection, bleeding, nerve injury,  blood clots, cardiopulmonary complications, morbidity, mortality, among others, and they were willing to proceed.   Preoperative templating of the joint replacement has been completed, documented, and submitted to the Operating Room personnel in order to optimize intra-operative equipment management.   Glee Arvin, MD 11/20/2019 7:07 AM

## 2019-11-20 NOTE — Anesthesia Postprocedure Evaluation (Signed)
Anesthesia Post Note  Patient: Jerry Patel  Procedure(s) Performed: LEFT TOTAL HIP ARTHROPLASTY ANTERIOR APPROACH (Left Hip)     Patient location during evaluation: PACU Anesthesia Type: MAC and Spinal Level of consciousness: awake and alert Pain management: pain level controlled Vital Signs Assessment: post-procedure vital signs reviewed and stable Respiratory status: spontaneous breathing and respiratory function stable Cardiovascular status: blood pressure returned to baseline and stable Postop Assessment: spinal receding Anesthetic complications: no   No complications documented.  Last Vitals:  Vitals:   11/20/19 1526 11/20/19 1530  BP: 122/79   Pulse: (!) 50   Resp: 18   Temp:  (!) 36.3 C  SpO2: 100%     Last Pain:  Vitals:   11/20/19 1511  TempSrc:   PainSc: 0-No pain                 Rhylen Shaheen DANIEL

## 2019-11-20 NOTE — Evaluation (Signed)
Physical Therapy Evaluation Patient Details Name: Jerry Patel MRN: [id number] DOB: 08-17-1975 Today's Date: 11/20/2019   History of Present Illness  Pt is a 44 y/o male s/p L THA, direct anterior. PMH includes HTN and TB.   Clinical Impression  Pt is s/p surgery above with deficits below. Pt requiring min to min guard A for mobility using RW. Overall tolerated mobility well. Reports he lives with a roommate who works in the morning, so will be home alone during some of the day. Will need to ensure pt is as independent as possible prior to d/c. Will continue to follow acutely to maximize functional mobility independence and safety.     Follow Up Recommendations Follow surgeon's recommendation for DC plan and follow-up therapies    Equipment Recommendations  Rolling walker with 5" wheels;3in1 (PT)    Recommendations for Other Services       Precautions / Restrictions Precautions Precautions: Fall Restrictions Weight Bearing Restrictions: Yes LLE Weight Bearing: Weight bearing as tolerated      Mobility  Bed Mobility Overal bed mobility: Needs Assistance Bed Mobility: Supine to Sit     Supine to sit: Min assist     General bed mobility comments: Min A for LLE assist  Transfers Overall transfer level: Needs assistance Equipment used: Rolling walker (2 wheeled) Transfers: Sit to/from Stand Sit to Stand: Min guard         General transfer comment: Min guard for safety. cues for safe hand placement.   Ambulation/Gait Ambulation/Gait assistance: Min guard;Min assist Gait Distance (Feet): 75 Feet Assistive device: Rolling walker (2 wheeled) Gait Pattern/deviations: Step-to pattern;Decreased step length - right;Decreased step length - left;Decreased weight shift to left;Antalgic Gait velocity: Decreased   General Gait Details: Mild instability noted at L hip. cues for proximity and sequencing with RW. Pt reporting L leg felt longer than R after surgery.    Stairs            Wheelchair Mobility    Modified Rankin (Stroke Patients Only)       Balance Overall balance assessment: Needs assistance Sitting-balance support: No upper extremity supported Sitting balance-Leahy Scale: Good     Standing balance support: Bilateral upper extremity supported Standing balance-Leahy Scale: Poor Standing balance comment: Reliant on BUE support                              Pertinent Vitals/Pain Pain Assessment: 0-10 Pain Score: 4  Pain Location: L hip  Pain Descriptors / Indicators: Operative site guarding Pain Intervention(s): Limited activity within patient's tolerance;Monitored during session;Repositioned    Home Living Family/patient expects to be discharged to:: Private residence Living Arrangements: Non-relatives/Friends Available Help at Discharge: Friend(s);Available PRN/intermittently Type of Home: House Home Access: Stairs to enter Entrance Stairs-Rails: None Entrance Stairs-Number of Steps: 2 Home Layout: Two level Home Equipment: None      Prior Function Level of Independence: Independent               Hand Dominance        Extremity/Trunk Assessment   Upper Extremity Assessment Upper Extremity Assessment: Overall WFL for tasks assessed    Lower Extremity Assessment Lower Extremity Assessment: LLE deficits/detail LLE Deficits / Details: Deficits consistent with post op pain and weakness.     Cervical / Trunk Assessment Cervical / Trunk Assessment: Normal  Communication   Communication: Prefers language other than [place];Interpreter utilized (In [employer])  Cognition Arousal/Alertness: Awake/alert Behavior During Therapy:  WFL for tasks assessed/performed Overall Cognitive Status: Within Functional Limits for tasks assessed                                        General Comments      Exercises     Assessment/Plan    PT Assessment Patient needs  continued PT services  PT Problem List Decreased strength;Decreased balance;Decreased activity tolerance;Decreased mobility;Decreased knowledge of use of DME;Decreased knowledge of precautions       PT Treatment Interventions DME instruction;Gait training;Stair training;Functional mobility training;Therapeutic activities;Therapeutic exercise;Balance training;Patient/family education    PT Goals (Current goals can be found in the Care Plan section)  Acute Rehab PT Goals Patient Stated Goal: to go home PT Goal Formulation: With patient Time For Goal Achievement: 12/04/19 Potential to Achieve Goals: Good    Frequency 7X/week   Barriers to discharge Decreased caregiver support      Co-evaluation               AM-PAC PT "6 Clicks" Mobility  Outcome Measure Help needed turning from your back to your side while in a flat bed without using bedrails?: None Help needed moving from lying on your back to sitting on the side of a flat bed without using bedrails?: A Little Help needed moving to and from a bed to a chair (including a wheelchair)?: A Little Help needed standing up from a chair using your arms (e.g., wheelchair or bedside chair)?: A Little Help needed to walk in hospital room?: A Little Help needed climbing 3-5 steps with a railing? : A Lot 6 Click Score: 18    End of Session Equipment Utilized During Treatment: Gait belt Activity Tolerance: Patient tolerated treatment well Patient left: in chair;with call bell/phone within reach Nurse Communication: Mobility status PT Visit Diagnosis: Unsteadiness on feet (R26.81);Muscle weakness (generalized) (M62.81)    Time: 6384-6659 PT Time Calculation (min) (ACUTE ONLY): 23 min   Charges:   PT Evaluation $PT Eval Low Complexity: 1 Low          Cindee Salt, DPT  Acute Rehabilitation Services  Pager: [phone] Office: [phone]   Lehman Prom 11/20/2019, 5:06 PM

## 2019-11-20 NOTE — Transfer of Care (Signed)
Immediate Anesthesia Transfer of Care Note  Patient: Jerry Patel  Procedure(s) Performed: LEFT TOTAL HIP ARTHROPLASTY ANTERIOR APPROACH (Left Hip)  Patient Location: PACU  Anesthesia Type:MAC and Spinal  Level of Consciousness: drowsy and patient cooperative  Airway & Oxygen Therapy: Patient Spontanous Breathing and Patient connected to face mask oxygen  Post-op Assessment: Report given to RN and Post -op Vital signs reviewed and stable  Post vital signs: Reviewed and stable  Last Vitals:  Vitals Value Taken Time  BP 91/63 11/20/19 1440  Temp    Pulse 60 11/20/19 1440  Resp 24 11/20/19 1440  SpO2 100 % 11/20/19 1440  Vitals shown include unvalidated device data.  Last Pain:  Vitals:   11/20/19 1035  TempSrc: Oral         Complications: No complications documented.

## 2019-11-20 NOTE — Op Note (Signed)
LEFT TOTAL HIP ARTHROPLASTY ANTERIOR APPROACH  Procedure Note Jerry Patel   [id number]  Pre-op Diagnosis: left hip dysplasia, degenerative joint disease     Post-op Diagnosis: same   Operative Procedures  1. Total hip replacement; Left hip; uncemented [id number]   Personnel  Surgeon(s): Tarry Kos, MD  Assist: Oneal Grout, PA-C; necessary for the timely completion of procedure and due to complexity of procedure.   Anesthesia: spinal  Prosthesis: Depuy Acetabulum: Pinnacle 50 mm Femur: Actis 2 STD Head: 32 mm size: +1 Liner: +4 Bearing Type: ceramic on poly  Total Hip Arthroplasty (Anterior Approach) Op Note:  After informed consent was obtained and the operative extremity marked in the holding area, the patient was brought back to the operating room and placed supine on the HANA table. Next, the operative extremity was prepped and draped in normal sterile fashion. Surgical timeout occurred verifying patient identification, surgical site, surgical procedure and administration of antibiotics.  A modified anterior Smith-Peterson approach to the hip was performed, using the interval between tensor fascia lata and sartorius.  Dissection was carried bluntly down onto the anterior hip capsule. The lateral femoral circumflex vessels were identified and coagulated. A capsulotomy was performed and the capsular flaps tagged for later repair.  The neck osteotomy was performed. The femoral head was removed, the acetabular rim was cleared of soft tissue and attention was turned to reaming the acetabulum.  Sequential reaming was performed under fluoroscopic guidance. We reamed to a size 49 mm, and then impacted the acetabular shell. A 25 mm cancellous screw was placed through the shell for added fixation.  The liner was then placed after irrigation and attention turned to the femur.  After placing the femoral hook, the leg was taken to externally rotated, extended and adducted  position taking care to perform soft tissue releases to allow for adequate mobilization of the femur. Soft tissue was cleared from the shoulder of the greater trochanter and the hook elevator used to improve exposure of the proximal femur.  In order to improve exposure, a small area of the TFL was released off of the ASIS and later repaired during closure using #1 vicryl. Sequential broaching performed up to a size 2. Trial neck and head were placed. The leg was brought back up to neutral and the construct reduced.  Antibiotic irrigation was placed in the surgical wound and kept for at least 1 minute.  The position and sizing of components, offset and leg lengths were checked using fluoroscopy. Stability of the construct was checked in extension and external rotation without any subluxation or impingement of prosthesis. We dislocated the prosthesis, dropped the leg back into position, removed trial components, and irrigated copiously. The final stem and head was then placed, the leg brought back up, the system reduced and fluoroscopy used to verify positioning.  We irrigated, obtained hemostasis and closed the capsule using #2 ethibond suture.  One gram of vancomycin powder was placed in the surgical bed. A dilute solution of 20 cc of normal saline, 1.3% exparel, 0.25% bupivacaine was injected in the soft tissues.  One gram of topical tranexamic acid was injected into the joint.  The fascia was closed with #1 vicryl plus, the deep fat layer was closed with 0 vicryl, the subcutaneous layers closed with 2.0 Vicryl Plus and the skin closed with 3.0 monocryl and steri strips. A sterile dressing was applied. The patient was awakened in the operating room and taken to recovery in stable condition.  All  sponge, needle, and instrument counts were correct at the end of the case.   Position: supine  Complications: see description of procedure.  Time Out: performed   Drains/Packing: none  Estimated blood loss: see  anesthesia record  Returned to Recovery Room: in good condition.   Antibiotics: yes   Mechanical VTE (DVT) Prophylaxis: sequential compression devices, TED thigh-high  Chemical VTE (DVT) Prophylaxis: aspirin   Fluid Replacement: see anesthesia record  Specimens Removed: 1 to pathology   Sponge and Instrument Count Correct? yes   PACU: portable radiograph - low AP   Plan/RTC: Return in 2 weeks for staple removal. Weight Bearing/Load Lower Extremity: full  Hip precautions: none Suture Removal: 2 weeks   N. Glee Arvin, MD [name] 2:03 PM   Implant Name Type Inv. Item Serial No. Manufacturer Lot No. LRB No. Used Action  CUP SECTOR GRIPTON - [id number] Cup CUP SECTOR GRIPTON  [employer] [id number] Left 1 Implanted  PINNACLE PLUS 4 NEUTRAL - [id number] Hips PINNACLE PLUS 4 NEUTRAL  [employer] [id number] Left 1 Implanted  SCREW 6.5MMX25MM - [id number] Screw SCREW 6.5MMX25MM  [employer] [id number] Left 1 Implanted  STEM FEM ACTIS STD SZ2 - [id number] Stem STEM FEM ACTIS STD SZ2  [employer] [id number] Left 1 Implanted  HEAD FEMORAL 32 CERAMIC - [id number] Hips HEAD FEMORAL 32 CERAMIC  [employer] [id number] Left 1 Implanted

## 2019-11-21 ENCOUNTER — Other Ambulatory Visit: Payer: Self-pay | Admitting: Physician Assistant

## 2019-11-21 ENCOUNTER — Encounter (HOSPITAL_COMMUNITY): Payer: Self-pay | Admitting: Orthopaedic Surgery

## 2019-11-21 DIAGNOSIS — M1632 Unilateral osteoarthritis resulting from hip dysplasia, left hip: Secondary | ICD-10-CM | POA: Diagnosis not present

## 2019-11-21 LAB — BASIC METABOLIC PANEL
Anion gap: 13 (ref 5–15)
BUN: 12 mg/dL (ref 6–20)
CO2: 28 mmol/L (ref 22–32)
Calcium: 9.9 mg/dL (ref 8.9–10.3)
Chloride: 94 mmol/L — ABNORMAL LOW (ref 98–111)
Creatinine, Ser: 1.19 mg/dL (ref 0.61–1.24)
GFR, Estimated: >60 mL/min (ref >60)
Glucose, Bld: 119 mg/dL — ABNORMAL HIGH (ref 70–99)
Potassium: 3.5 mmol/L (ref 3.5–5.1)
Sodium: 135 mmol/L (ref 135–145)

## 2019-11-21 LAB — CBC
HCT: 38.7 % — ABNORMAL LOW (ref 39.0–52.0)
Hemoglobin: 13.3 g/dL (ref 13.0–17.0)
MCH: 28.7 pg (ref 26.0–34.0)
MCHC: 34.4 g/dL (ref 30.0–36.0)
MCV: 83.4 fL (ref 80.0–100.0)
Platelets: 221 10*3/uL (ref 150–400)
RBC: 4.64 MIL/uL (ref 4.22–5.81)
RDW: 11.4 % — ABNORMAL LOW (ref 11.5–15.5)
WBC: 12.3 10*3/uL — ABNORMAL HIGH (ref 4.0–10.5)
nRBC: 0 % (ref 0.0–0.2)

## 2019-11-21 NOTE — Discharge Summary (Signed)
Patient IDDamen Windsor MRN: [id number] DOB/AGE: 02/13/1975 44 y.o.  Admit date: 11/20/2019 Discharge date: 11/21/2019  Admission Diagnoses:  Principal Problem:   Acquired dysplasia of hip, left Active Problems:   Status post total replacement of left hip   Discharge Diagnoses:  Same  Past Medical History:  Diagnosis Date  . Arthritis   . Hip dysplasia, acquired, left   . Hypertension   . Hypertriglyceridemia   . Latent tuberculosis    Completed treatment 10/2019    Surgeries: Procedure(s): LEFT TOTAL HIP ARTHROPLASTY ANTERIOR APPROACH on 11/20/2019   Consultants:   Discharged Condition: Improved  Hospital Course: Leveon Pelzer is an 44 y.o. male who was admitted 11/20/2019 for operative treatment ofAcquired dysplasia of hip, left. Patient has severe unremitting pain that affects sleep, daily activities, and work/hobbies. After pre-op clearance the patient was taken to the operating room on 11/20/2019 and underwent  Procedure(s): LEFT TOTAL HIP ARTHROPLASTY ANTERIOR APPROACH.    Patient was given perioperative antibiotics:  Anti-infectives (From admission, onward)   Start     Dose/Rate Route Frequency Ordered Stop   11/20/19 2000  ceFAZolin (ANCEF) IVPB 2g/100 mL premix        2 g 200 mL/hr over 30 Minutes Intravenous Every 6 hours 11/20/19 1557 11/21/19 0222   11/20/19 1333  vancomycin (VANCOCIN) powder  Status:  Discontinued          As needed 11/20/19 1333 11/20/19 1435   11/20/19 1215  ceFAZolin (ANCEF) 2-4 GM/100ML-% IVPB       Note to Pharmacy: Oswaldo Milian   : cabinet override      11/20/19 1215 11/20/19 1247   11/20/19 1045  ceFAZolin (ANCEF) IVPB 2g/100 mL premix        2 g 200 mL/hr over 30 Minutes Intravenous On call to O.R. 11/20/19 1041 11/20/19 1305       Patient was given sequential compression devices, early ambulation, and chemoprophylaxis to prevent DVT.  Patient benefited maximally from hospital stay and there were no complications.     Recent vital signs:  Patient Vitals for the past 24 hrs:  BP Temp Temp src Pulse Resp SpO2 Height Weight  11/21/19 0716 102/69 97.8 F (36.6 C) Oral 65 16 99 % -- --  11/21/19 0424 128/84 98.4 F (36.9 C) Oral (!) 59 20 98 % -- --  11/20/19 2326 120/71 98.4 F (36.9 C) Oral 74 20 99 % -- --  11/20/19 1922 111/68 98.4 F (36.9 C) Oral 64 20 99 % -- --  11/20/19 1554 105/74 97.8 F (36.6 C) -- (!) 51 18 98 % -- --  11/20/19 1530 -- (!) 97.4 F (36.3 C) -- -- -- -- -- --  11/20/19 1526 122/79 -- -- (!) 50 18 100 % -- --  11/20/19 1511 131/81 -- -- 98 18 100 % -- --  11/20/19 1457 112/81 -- -- (!) 50 17 100 % -- --  11/20/19 1440 91/63 (!) 96.8 F (36 C) -- 60 20 100 % -- --  11/20/19 1035 (!) 161/83 97.7 F (36.5 C) Oral 69 20 100 % 5\' 2"  (1.575 m) 62.6 kg     Recent laboratory studies:  Recent Labs    11/21/19 0330  WBC 12.3*  HGB 13.3  HCT 38.7*  PLT 221  NA 135  K 3.5  CL 94*  CO2 28  BUN 12  CREATININE 1.19  GLUCOSE 119*  CALCIUM 9.9     Discharge Medications:   Allergies as of 11/21/2019  No Known Allergies     Medication List    TAKE these medications   amLODipine 10 MG tablet Commonly known as: NORVASC Take 1 tablet (10 mg total) by mouth at bedtime. What changed: when to take this   aspirin EC 81 MG tablet Take 1 tablet (81 mg total) by mouth 2 (two) times daily.   docusate sodium 100 MG capsule Commonly known as: Colace Take 1 capsule (100 mg total) by mouth daily as needed.   losartan-hydrochlorothiazide 50-12.5 MG tablet Commonly known as: Hyzaar Take 2 tablets by mouth daily. What changed: when to take this   methocarbamol 500 MG tablet Commonly known as: Robaxin Take 1 tablet (500 mg total) by mouth 2 (two) times daily as needed.   ondansetron 4 MG tablet Commonly known as: Zofran Take 1 tablet (4 mg total) by mouth every 8 (eight) hours as needed for nausea or vomiting.   oxyCODONE-acetaminophen 5-325 MG tablet Commonly  known as: Percocet Take 1-2 tablets by mouth every 6 (six) hours as needed.            Durable Medical Equipment  (From admission, onward)         Start     Ordered   11/20/19 1558  DME Walker rolling  Once       Question:  Patient needs a walker to treat with the following condition  Answer:  History of hip replacement   11/20/19 1557   11/20/19 1558  DME 3 n 1  Once        11/20/19 1557   11/20/19 1558  DME Bedside commode  Once       Question:  Patient needs a bedside commode to treat with the following condition  Answer:  History of hip replacement   11/20/19 1557          Diagnostic Studies: DG Chest 2 View  Result Date: 11/16/2019 CLINICAL DATA:  Pre-admission examination. Patient for hip replacement. EXAM: CHEST - 2 VIEW COMPARISON:  PA and lateral chest 07/06/2019. FINDINGS: Lungs clear. Heart size normal. No pneumothorax or pleural fluid. No acute or focal bony abnormality. IMPRESSION: Negative chest. Electronically Signed   By: Drusilla Kanner M.D.   On: 11/16/2019 15:42   DG Pelvis Portable  Result Date: 11/20/2019 CLINICAL DATA:  44 year old male status post left hip replacement. EXAM: PORTABLE PELVIS 1-2 VIEWS COMPARISON:  Intraoperative images today.  Pelvis CT 08/07/2019. FINDINGS: AP view at 1449 hours. Sequelae of left total hip arthroplasty. Hardware appears intact with normal AP alignment. No unexpected osseous changes. Small 9 mm round ossific fragment projects lateral to the acetabular hardware. Regional postoperative soft tissue gas. Negative visible bowel gas pattern. IMPRESSION: Left total hip arthroplasty with no adverse features identified. Electronically Signed   By: Odessa Fleming M.D.   On: 11/20/2019 15:24   DG C-Arm 1-60 Min  Result Date: 11/20/2019 CLINICAL DATA:  44 year old male undergoing left hip surgery. EXAM: OPERATIVE LEFT HIP (WITH PELVIS IF PERFORMED) 5 VIEWS TECHNIQUE: Fluoroscopic spot image(s) were submitted for interpretation  post-operatively. COMPARISON:  pelvis CT 08/07/2019. Pelvis radiograph 07/26/2019 FINDINGS: The 2 initial images redemonstrate dysplastic left hip. Subsequent images demonstrate placement of left total hip arthroplasty. Hardware appears intact, and has normal AP alignment on the included views. No unexpected osseous changes. IMPRESSION: Left hip dysplasia and subsequent left total hip arthroplasty. FLUOROSCOPY TIME:  0 minutes 40 seconds Electronically Signed   By: Odessa Fleming M.D.   On: 11/20/2019 15:23   DG  HIP OPERATIVE UNILAT W OR W/O PELVIS LEFT  Result Date: 11/20/2019 CLINICAL DATA:  44 year old male undergoing left hip surgery. EXAM: OPERATIVE LEFT HIP (WITH PELVIS IF PERFORMED) 5 VIEWS TECHNIQUE: Fluoroscopic spot image(s) were submitted for interpretation post-operatively. COMPARISON:  pelvis CT 08/07/2019. Pelvis radiograph 07/26/2019 FINDINGS: The 2 initial images redemonstrate dysplastic left hip. Subsequent images demonstrate placement of left total hip arthroplasty. Hardware appears intact, and has normal AP alignment on the included views. No unexpected osseous changes. IMPRESSION: Left hip dysplasia and subsequent left total hip arthroplasty. FLUOROSCOPY TIME:  0 minutes 40 seconds Electronically Signed   By: Odessa Fleming M.D.   On: 11/20/2019 15:23    Disposition: Discharge disposition: 01-Home or Self Care          Follow-up Information    Tarry Kos, MD. Schedule an appointment as soon as possible for a visit in 2 week(s).   Specialty: Orthopedic Surgery Contact information: [address] [place] [address] [phone]                Signed: Cristie Hem 11/21/2019, 8:01 AM

## 2019-11-21 NOTE — Discharge Instructions (Signed)

## 2019-11-21 NOTE — Plan of Care (Signed)
Patient alert and oriented, mae's well, voiding adequate amount of urine, swallowing without difficulty, no c/o pain at time of discharge. Patient discharged home with family. Script and discharged instructions given to patient. Patient and family stated understanding of instructions given. Patient has an appointment with Dr. Xu 

## 2019-11-21 NOTE — Progress Notes (Signed)
Physical Therapy Treatment Patient Details Name: Jerry Patel MRN: [id number] DOB: 10-19-1975 Today's Date: 11/21/2019    History of Present Illness Pt is a 44 y/o male s/p L THA, direct anterior. PMH includes HTN and TB.     PT Comments    Patient progressing with hallway ambulation and able to negotiate steps as he will need to at home.  Taught how to manage RW on steps in case he needs it, right now not using RW in small spaces.  Educated on HEP and issued for home.  Feel he will be able to d/c home with intermittent A at d//c.   Follow Up Recommendations  Follow surgeon's recommendation for DC plan and follow-up therapies     Equipment Recommendations  Rolling walker with 5" wheels;3in1 (PT)    Recommendations for Other Services       Precautions / Restrictions Precautions Precautions: Fall Restrictions LLE Weight Bearing: Weight bearing as tolerated    Mobility  Bed Mobility   Bed Mobility: Supine to Sit     Supine to sit: Supervision        Transfers Overall transfer level: Needs assistance Equipment used: Rolling walker (2 wheeled) Transfers: Sit to/from Stand Sit to Stand: Supervision         General transfer comment: for safety  Ambulation/Gait Ambulation/Gait assistance: Supervision Gait Distance (Feet): 250 Feet Assistive device: Rolling walker (2 wheeled);None Gait Pattern/deviations: Step-through pattern;Decreased stride length;Antalgic     General Gait Details: increased time, but steady with RW, not using RW in small spaces in the room, but little more antalgic, reaching to touch surfaces for balance   Stairs Stairs: Yes Stairs assistance: Supervision;Min assist Stair Management: Step to pattern;Forwards;With walker Number of Stairs: 10 General stair comments: cues and assist for how to manage the RW safely and to keep two feet on step while moving the RW.   Wheelchair Mobility    Modified Rankin (Stroke Patients Only)        Balance Overall balance assessment: Needs assistance Sitting-balance support: Feet supported Sitting balance-Leahy Scale: Good     Standing balance support: No upper extremity supported;During functional activity Standing balance-Leahy Scale: Fair Standing balance comment: in bathroom not using UE support washing hands                            Cognition Arousal/Alertness: Awake/alert Behavior During Therapy: WFL for tasks assessed/performed Overall Cognitive Status: Within Functional Limits for tasks assessed                                        Exercises Total Joint Exercises Ankle Circles/Pumps: AROM;Both;10 reps;Supine Quad Sets: AROM;Left;10 reps;Supine Short Arc Quad: AROM;Left;10 reps;Supine Heel Slides: AROM;10 reps;Left Hip ABduction/ADduction: AROM;Left;10 reps;Supine    General Comments General comments (skin integrity, edema, etc.): handout issued for HEP      Pertinent Vitals/Pain Pain Assessment: Faces Faces Pain Scale: Hurts little more Pain Location: L hip with exercise Pain Descriptors / Indicators: Operative site guarding Pain Intervention(s): Monitored during session;Repositioned    Home Living                      Prior Function            PT Goals (current goals can now be found in the care plan section) Progress towards PT goals: Progressing toward goals  Frequency    7X/week      PT Plan      Co-evaluation              AM-PAC PT "6 Clicks" Mobility   Outcome Measure  Help needed turning from your back to your side while in a flat bed without using bedrails?: None Help needed moving from lying on your back to sitting on the side of a flat bed without using bedrails?: None Help needed moving to and from a bed to a chair (including a wheelchair)?: None Help needed standing up from a chair using your arms (e.g., wheelchair or bedside chair)?: None Help needed to walk in hospital  room?: None Help needed climbing 3-5 steps with a railing? : A Little 6 Click Score: 23    End of Session   Activity Tolerance: Patient tolerated treatment well Patient left: in chair;with call bell/phone within reach   PT Visit Diagnosis: Other abnormalities of gait and mobility (R26.89);Pain Pain - Right/Left: Left Pain - part of body: Hip     Time: 4782-9562 PT Time Calculation (min) (ACUTE ONLY): 40 min  Charges:  $Gait Training: 8-22 mins $Therapeutic Exercise: 8-22 mins $Therapeutic Activity: 8-22 mins                     Sheran Lawless, PT Acute Rehabilitation Services [phone] [phone] 11/21/2019    Jerry Patel 11/21/2019, 12:04 PM

## 2019-11-21 NOTE — Progress Notes (Signed)
Subjective: 1 Day Post-Op Procedure(s) (LRB): LEFT TOTAL HIP ARTHROPLASTY ANTERIOR APPROACH (Left) Patient reports pain as mild.    Objective: Vital signs in last 24 hours: Temp:  [96.8 F (36 C)-98.4 F (36.9 C)] 97.8 F (36.6 C) (10/12 0716) Pulse Rate:  [50-98] 65 (10/12 0716) Resp:  [16-20] 16 (10/12 0716) BP: (91-161)/(63-84) 102/69 (10/12 0716) SpO2:  [98 %-100 %] 99 % (10/12 0716) Weight:  [62.6 kg] 62.6 kg (10/11 1035)  Intake/Output from previous day: 10/11 0701 - 10/12 0700 In: 1900 [I.V.:1200] Out: 700 [Urine:550; Blood:150] Intake/Output this shift: No intake/output data recorded.  Recent Labs    11/21/19 0330  HGB 13.3   Recent Labs    11/21/19 0330  WBC 12.3*  RBC 4.64  HCT 38.7*  PLT 221   Recent Labs    11/21/19 0330  NA 135  K 3.5  CL 94*  CO2 28  BUN 12  CREATININE 1.19  GLUCOSE 119*  CALCIUM 9.9   No results for input(s): LABPT, INR in the last 72 hours.  Neurologically intact Neurovascular intact Sensation intact distally Intact pulses distally Dorsiflexion/Plantar flexion intact Incision: scant drainage No cellulitis present Compartment soft   Assessment/Plan: 1 Day Post-Op Procedure(s) (LRB): LEFT TOTAL HIP ARTHROPLASTY ANTERIOR APPROACH (Left) Advance diet Up with therapy D/C IV fluids Discharge home with home health after first or second PT session WBAT LLE     Cristie Hem 11/21/2019, 7:59 AM

## 2019-12-05 ENCOUNTER — Encounter: Payer: Self-pay | Admitting: Orthopaedic Surgery

## 2019-12-05 ENCOUNTER — Other Ambulatory Visit: Payer: Self-pay

## 2019-12-05 ENCOUNTER — Ambulatory Visit (INDEPENDENT_AMBULATORY_CARE_PROVIDER_SITE_OTHER): Payer: Medicaid Other | Admitting: Orthopaedic Surgery

## 2019-12-05 DIAGNOSIS — Z96642 Presence of left artificial hip joint: Secondary | ICD-10-CM

## 2019-12-05 MED ORDER — CEPHALEXIN 500 MG PO CAPS: 500.0000 mg | ORAL_CAPSULE | Freq: Four times a day (QID) | ORAL | 0 refills | Status: AC

## 2019-12-05 NOTE — Progress Notes (Signed)
° °  Post-Op Visit Note   Patient: Jerry Patel           Date of Birth: Jan 12, 1976           MRN: [id number] Visit Date: 12/05/2019 PCP: Jerry Chandler, MD   Assessment & Plan:  Chief Complaint:  Chief Complaint  Patient presents with   Left Hip - Pain, Follow-up, Routine Post Op   Visit Diagnoses:  1. H/O total hip arthroplasty, left     Plan: Patient is a pleasant 44 year old gentleman who comes in today 2 weeks out left total hip replacement.  He has been doing well.  He has been getting home health physical therapy and ambulating with a walker.  He states that he has had some swelling and slight drainage to the incision.  No fevers or chills.  Examination of the left hip reveals a well healed surgical scar without complication.  He does have a moderate sized seroma.  This is nontender and nonerythematous.  Calves are soft nontender.  He is neurovascular intact distally.  Today, the seroma was aspirated.  Steri-Strips were applied.  I started him on prophylactic antibiotics for this.  He will follow up with [place] in 4 weeks time for repeat evaluation and AP pelvis x-rays but will call us sooner if his seroma returns.  Dental prophylaxis reinforced.  Follow-Up Instructions: Return in about 4 weeks (around 01/02/2020).   Orders:  No orders of the defined types were placed in this encounter.  Meds ordered this encounter  Medications   cephALEXin (KEFLEX) 500 MG capsule    Sig: Take 1 capsule (500 mg total) by mouth 4 (four) times daily.    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 No pertinent family history.  Past Surgical History:  Procedure Laterality Date   TOTAL HIP ARTHROPLASTY Left 11/20/2019   Procedure: LEFT TOTAL HIP ARTHROPLASTY ANTERIOR APPROACH;  Surgeon: Jerry Kos, MD;  Location: [facility] OR;  Service: Orthopedics;  Laterality: Left;   Social History   Occupational History   Not on file  Tobacco Use   Smoking status: Never Smoker   Smokeless tobacco: Never Used  Substance and Sexual Activity   Alcohol use: Yes    Alcohol/week: 2.0 standard drinks    Types: 2 Cans of beer per week   Drug use: Never   Sexual activity: Yes    Partners: Female    Comment: Wife- Jerry Patel- in [place]

## 2020-01-31 ENCOUNTER — Ambulatory Visit (INDEPENDENT_AMBULATORY_CARE_PROVIDER_SITE_OTHER): Payer: Medicaid Other | Admitting: Orthopaedic Surgery

## 2020-01-31 ENCOUNTER — Encounter: Payer: Self-pay | Admitting: Orthopaedic Surgery

## 2020-01-31 ENCOUNTER — Ambulatory Visit (INDEPENDENT_AMBULATORY_CARE_PROVIDER_SITE_OTHER): Payer: Medicaid Other

## 2020-01-31 ENCOUNTER — Other Ambulatory Visit: Payer: Self-pay

## 2020-01-31 DIAGNOSIS — Z96642 Presence of left artificial hip joint: Secondary | ICD-10-CM

## 2020-01-31 NOTE — Progress Notes (Signed)
° °  Post-Op Visit Note   Patient: Jerry Patel           Date of Birth: Aug 21, 1975           MRN: [id number] Visit Date: 01/31/2020 PCP: [name], MD   Assessment & Plan:  Chief Complaint:  Chief Complaint  Patient presents with   Left Hip - Routine Post Op, Follow-up   Visit Diagnoses:  1. Status post total replacement of left hip   2. H/O total hip arthroplasty, left     Plan:   Patient is approximately 10 weeks status post left total hip replacement.  He reports no pain doing well.  He is ready to go back to work on some restrictions.  Surgical scar is fully healed.  Leg lengths are equal.  Ambulating normally.  X-rays demonstrate stable total hip replacement without complications.  Activities medications dental prophylaxis reinforced today.  Work note provided today for no lifting more than 40 pounds for 3 months.  We will reevaluate this in 3 months when he comes back for 58-month visit.  He will need standing AP pelvis on return.  Questions encouraged and answered.  Follow-Up Instructions: Return in about 3 months (around 04/30/2020).   Orders:  Orders Placed This Encounter  Procedures   XR Pelvis 1-2 Views   No orders of the defined types were placed in this encounter.   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 No pertinent family history.  Past Surgical History:  Procedure Laterality Date   TOTAL HIP ARTHROPLASTY Left 11/20/2019   Procedure: LEFT TOTAL HIP ARTHROPLASTY ANTERIOR APPROACH;  Surgeon: Tarry Kos, MD;  Location: [facility] OR;  Service: Orthopedics;  Laterality: Left;   Social History   Occupational History   Not on file  Tobacco Use   Smoking status: Never Smoker   Smokeless tobacco: Never Used  Substance and Sexual Activity   Alcohol use: Yes    Alcohol/week: 2.0 standard drinks    Types: 2 Cans of beer per week   Drug use: Never   Sexual activity: Yes    Partners: Female    Comment: Wife- [name]- in [place]

## 2020-02-01 ENCOUNTER — Telehealth: Payer: Self-pay | Admitting: Orthopaedic Surgery

## 2020-02-01 NOTE — Telephone Encounter (Signed)
Received $25.00 cash and Disability paperwork    Forwarding to [employer]

## 2020-03-12 ENCOUNTER — Telehealth: Payer: Self-pay | Admitting: Family Medicine

## 2020-03-12 NOTE — Telephone Encounter (Signed)
Pt walked in wanting to make an appt. He is requesting a call from Dr. Manson Passey because his work schedule will not allow him to make an appointment.  He is available after 4 pm.

## 2020-03-12 NOTE — Telephone Encounter (Signed)
Red Team--please call patient and offer: - 410 visit with a resident - A visit with me for which I can write him a note for work.  Please let me know what he says. Terisa Starr, MD  Family Medicine Teaching Service

## 2020-03-18 ENCOUNTER — Telehealth: Payer: Self-pay

## 2020-03-18 NOTE — Telephone Encounter (Signed)
When patient calls to make an appointment please make it for AFTER 4.  Patient needs the latest available appointment because of his job.  Glennie Hawk, CMA

## 2020-03-18 NOTE — Telephone Encounter (Signed)
Called and LVM for patient to call [facility] to schedule an appointment.  Used [employer] ID # [id number].   Glennie Hawk, CMA

## 2020-04-30 ENCOUNTER — Encounter: Payer: Self-pay | Admitting: Orthopaedic Surgery

## 2020-04-30 ENCOUNTER — Ambulatory Visit (INDEPENDENT_AMBULATORY_CARE_PROVIDER_SITE_OTHER): Payer: Medicaid Other | Admitting: Orthopaedic Surgery

## 2020-04-30 ENCOUNTER — Ambulatory Visit (INDEPENDENT_AMBULATORY_CARE_PROVIDER_SITE_OTHER): Payer: Self-pay

## 2020-04-30 VITALS — Ht 62.0 in | Wt 138.0 lb

## 2020-04-30 DIAGNOSIS — Z96642 Presence of left artificial hip joint: Secondary | ICD-10-CM

## 2020-04-30 NOTE — Progress Notes (Signed)
Office Visit Note   Patient: Jerry Patel           Date of Birth: Dec 23, 1975           MRN: [id number] Visit Date: 04/30/2020              Requested by: [name], MD [address] [place],  [place] [address] PCP: [name], MD   Assessment & Plan: Visit Diagnoses:  1. Status post total replacement of left hip     Plan: Impression is 31-month status post left total hip replacement.  He is doing very well and very happy.  Activity as tolerated.  Predental prophylaxis reinforced.  Recheck in 6 months with repeat standing AP pelvis x-rays.  Follow-Up Instructions: Return in about 6 months (around 10/31/2020).   Orders:  Orders Placed This Encounter  Procedures  . XR Pelvis 1-2 Views   No orders of the defined types were placed in this encounter.     Procedures: No procedures performed   Clinical Data: No additional findings.   Subjective: Chief Complaint  Patient presents with  . Left Hip - Follow-up    Left total hip arthroplasty 11/20/2019    Patient returns today with interpreter for follow-up status post left total hip replacement.  Doing well has no complaints.  He ran 10 km this past weekend without any problems.  He is very happy.   Review of Systems  Constitutional: Negative.   All other systems reviewed and are negative.    Objective: Vital Signs: Ht 5\' 2"  (1.575 m)   Wt 138 lb (62.6 kg)   BMI 25.24 kg/m   Physical Exam Vitals and nursing note reviewed.  Constitutional:      Appearance: He is well-developed.  Pulmonary:     Effort: Pulmonary effort is normal.  Abdominal:     Palpations: Abdomen is soft.  Skin:    General: Skin is warm.  Neurological:     Mental Status: He is alert and oriented to person, place, and time.  Psychiatric:        Behavior: Behavior normal.        Thought Content: Thought content normal.        Judgment: Judgment normal.     Ortho Exam Left hip shows a fully healed surgical scar.   Excellent range of motion without pain.  Leg lengths are equal.  Normal gait. Specialty Comments:  No specialty comments available.  Imaging: XR Pelvis 1-2 Views  Result Date: 04/30/2020 Stable total hip replacement without complications    PMFS History: Patient Active Problem List   Diagnosis Date Noted  . Status post total replacement of left hip 11/20/2019  . 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 Latent tuberculosis 07/25/2019  . Hypertriglyceridemia 07/25/2019  . Refugee health examination 07/04/2019  . Essential hypertension 07/04/2019  . Acquired dysplasia of hip, left 07/04/2019   Past Medical History:  Diagnosis Date  . Arthritis   . Hip dysplasia, acquired, left   . Hypertension   . Hypertriglyceridemia   . Latent tuberculosis    Completed treatment 10/2019    History reviewed. No pertinent family history.  Past Surgical History:  Procedure Laterality Date  . TOTAL HIP ARTHROPLASTY Left 11/20/2019   Procedure: LEFT TOTAL HIP ARTHROPLASTY ANTERIOR APPROACH;  Surgeon: 01/20/2020, MD;  Location: [facility] OR;  Service: Orthopedics;  Laterality: Left;   Social History   Occupational History  . Not on file  Tobacco  Use  . Smoking status: Never Smoker  . Smokeless tobacco: Never Used  Substance and Sexual Activity  . Alcohol use: Yes    Alcohol/week: 2.0 standard drinks    Types: 2 Cans of beer per week  . Drug use: Never  . Sexual activity: Yes    Partners: Female    Comment: Wife- [name]- in [place]

## 2022-03-19 IMAGING — CT CT PELVIS W/O CM
2 of 6 series · 13 of 46 positions shown, 18 images · non-contrast
Comparison: Radiographs 07/06/2019

CLINICAL DATA: Chronic left hip pain. Left hip dysplasia.
Preoperative planning.

EXAM:
CT PELVIS WITHOUT CONTRAST
TECHNIQUE: Multidetector CT imaging of the pelvis was performed following the
standard protocol without intravenous contrast.

[Series 8: pelvis 2.00 br40 s3 axial st · axial · 0.72mm/px · z∈[+1210,+1436]mm · 10 of 131 slices shown, 15 images (1 of 2)]
[im 9/131  soft-tissue]
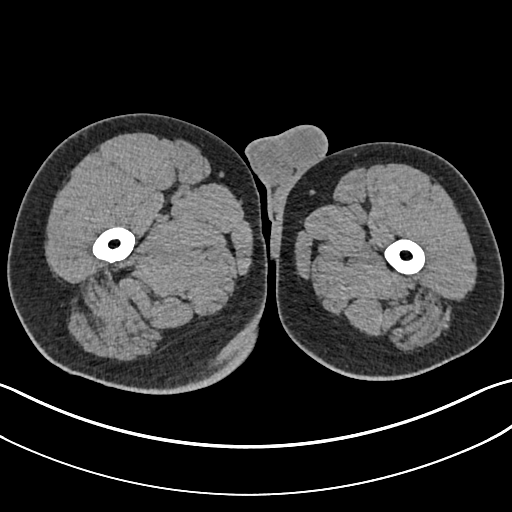
[im 9/131  bone]
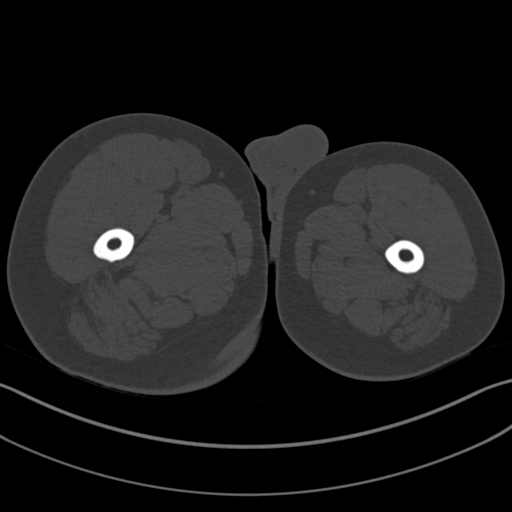
[im 27/131  soft-tissue]
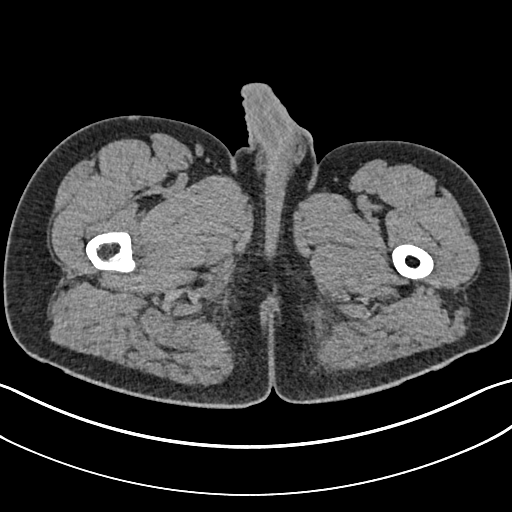
[im 35/131  soft-tissue]
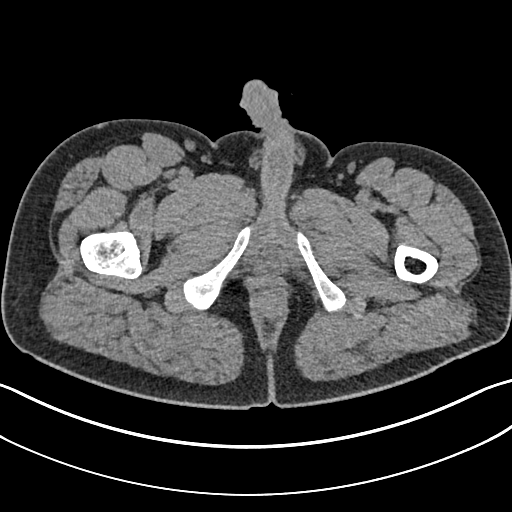
[im 53/131  soft-tissue]
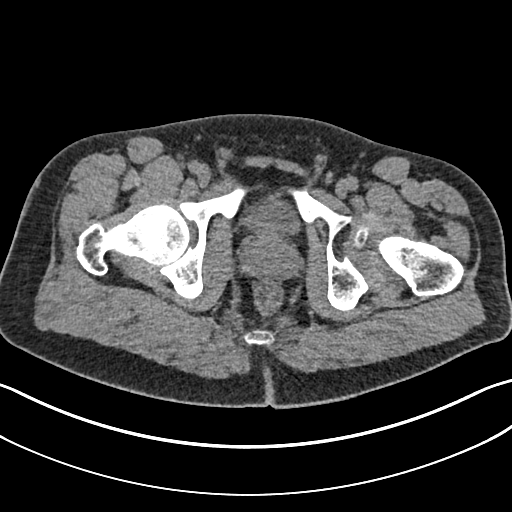
[im 70/131  soft-tissue]
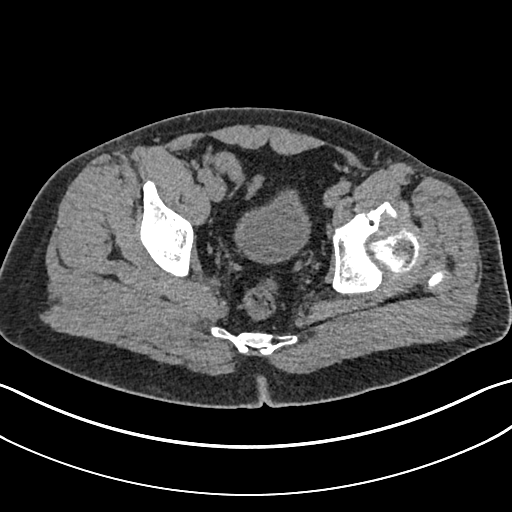
[im 79/131  soft-tissue]
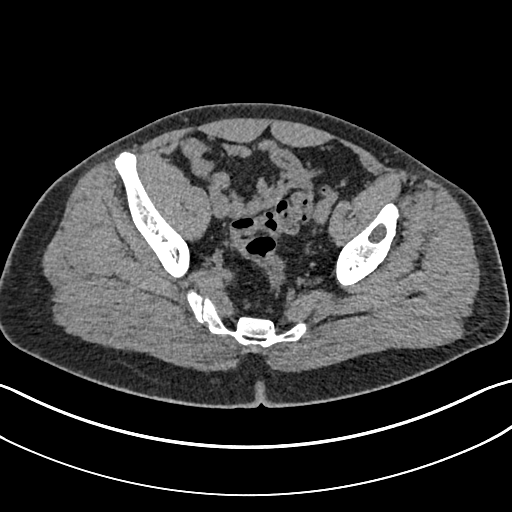
[im 96/131  soft-tissue]
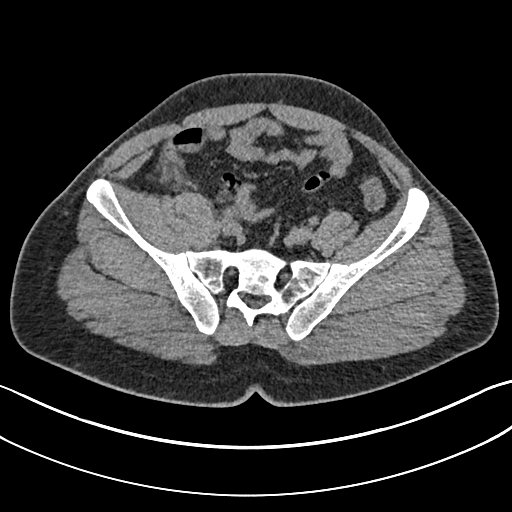
[im 96/131  lung]
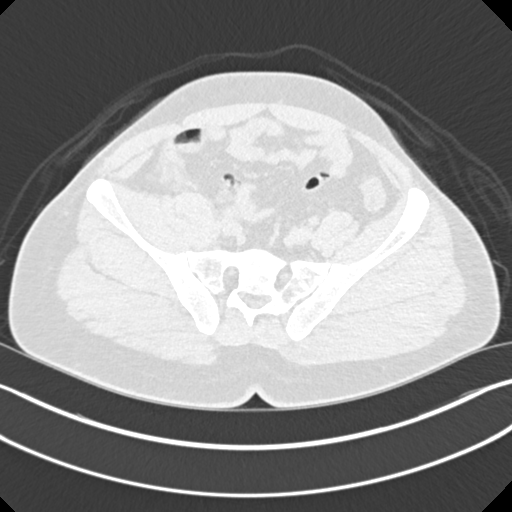
[im 105/131  soft-tissue]
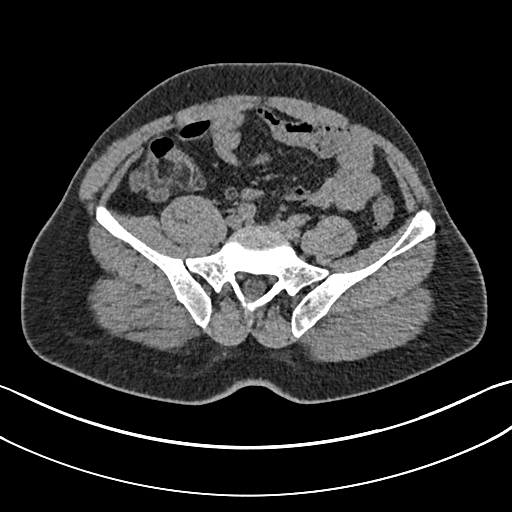
[im 105/131  lung]
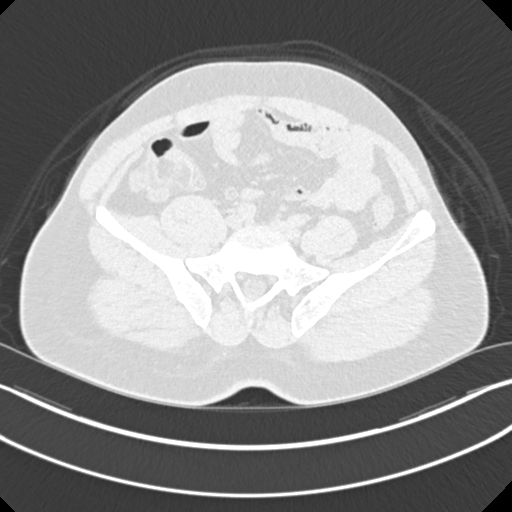
[im 113/131  lung]
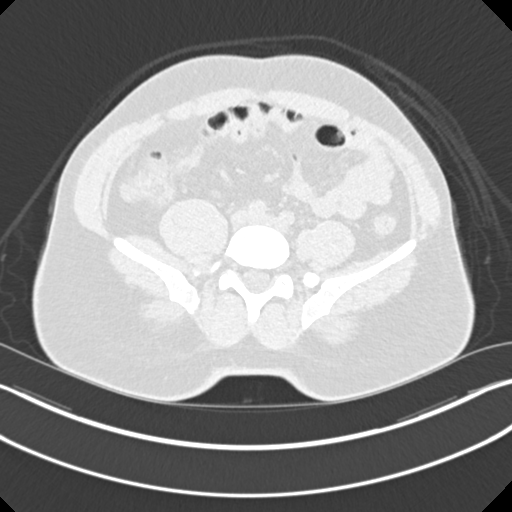
[im 122/131  soft-tissue]
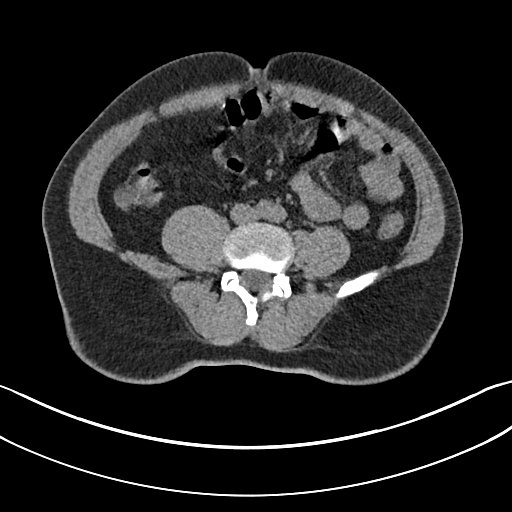
[im 122/131  lung]
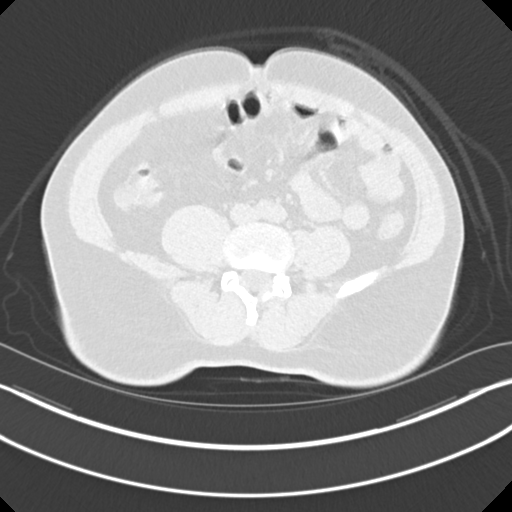
[im 122/131  bone]
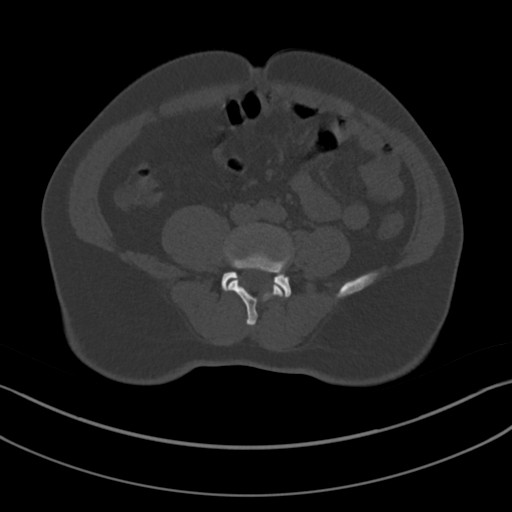

[Series 10: pelvis 2.00 br40 s3 axial st · coronal · 0.51mm/px · 3 of 184 slices shown (2 of 2)]
[im 46/184  soft-tissue]
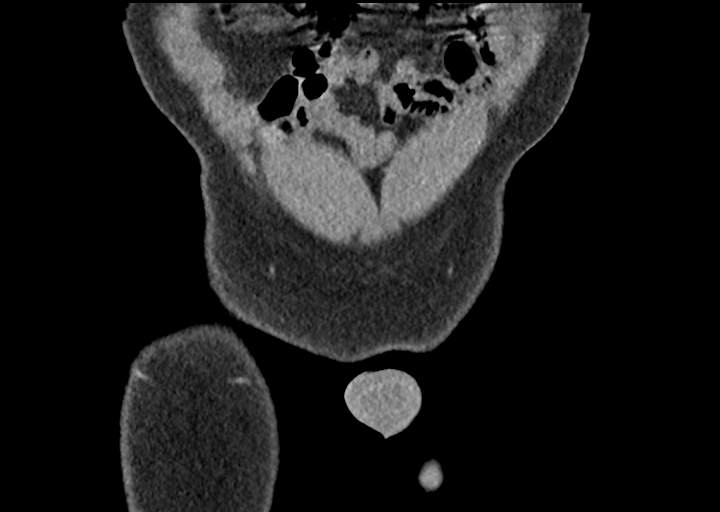
[im 92/184  soft-tissue]
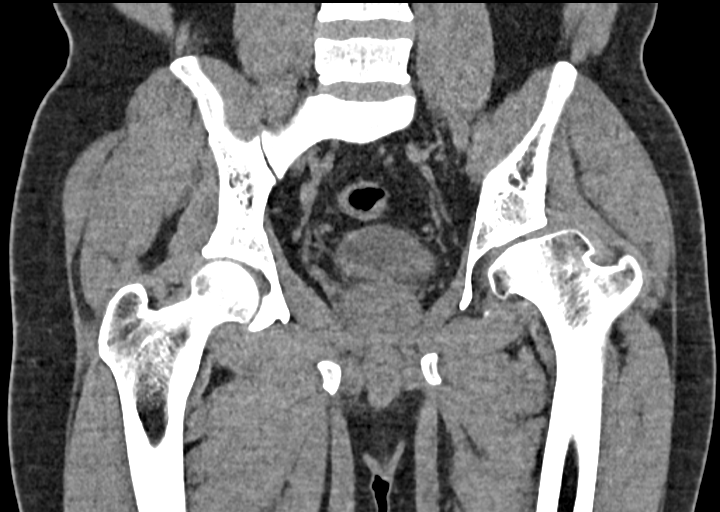
[im 138/184  soft-tissue]
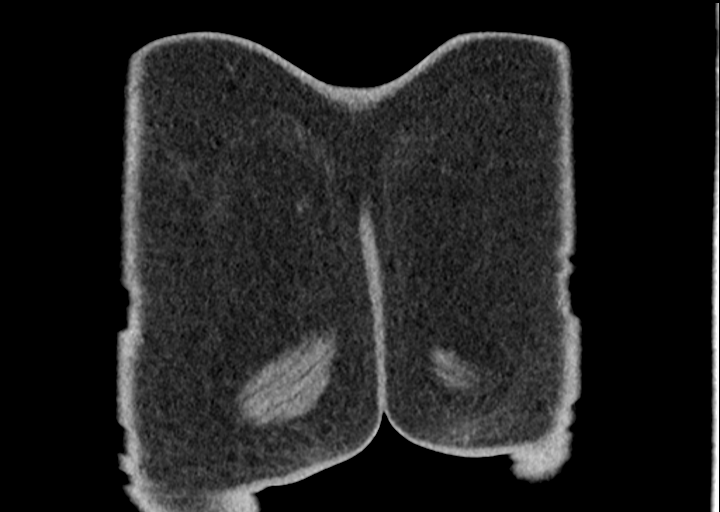

[13 of 46 positions shown; findings below may reference images not displayed]

FINDINGS: As demonstrated on the prior radiographs, there is significant left
hip dysplasia. Coxa magna deformity with a flattened irregular left
femoral head with subchondral cystic change and a rounded loose body
in the joint.

The acetabulum also demonstrates subchondral cystic changes and is
slightly widened but the acetabular angle is normal.

The right hip is normally located and does not demonstrate any hip
dysplasia.

The pubic symphysis and SI joints are intact. No pelvic fractures or
bone lesions.

No significant intrapelvic abnormalities are identified. No inguinal
mass or hernia.
IMPRESSION: 1. Severe left hip dysplasia with a flattened irregular left femoral
head, subchondral cystic change and a rounded loose body in the
joint.
2. No pelvic fractures or bone lesions.

## 2022-08-21 ENCOUNTER — Encounter: Payer: Self-pay | Admitting: Family Medicine

## 2022-08-21 ENCOUNTER — Ambulatory Visit (INDEPENDENT_AMBULATORY_CARE_PROVIDER_SITE_OTHER): Payer: Self-pay | Admitting: Family Medicine

## 2022-08-21 VITALS — BP 159/98 | HR 64 | Ht 62.0 in | Wt 153.8 lb

## 2022-08-21 DIAGNOSIS — I10 Essential (primary) hypertension: Secondary | ICD-10-CM

## 2022-08-21 MED ORDER — AMLODIPINE BESYLATE 10 MG PO TABS: 10.0000 mg | ORAL_TABLET | Freq: Every day | ORAL | 3 refills | Status: AC

## 2022-08-21 MED ORDER — LOSARTAN POTASSIUM-HCTZ 100-25 MG PO TABS: 1.0000 | ORAL_TABLET | Freq: Every day | ORAL | 2 refills | Status: AC

## 2022-08-21 NOTE — Progress Notes (Signed)
  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 He stopped taking them. However, after his CDL visit yesterday he did take 1 dose of amlodipine which he had leftover from before.  No severe headaches, vision changes, abdominal pain, CP/SOB   PERTINENT  PMH / PSH:   Past Medical History:  Diagnosis Date   Arthritis    Hip dysplasia, acquired, left    Hypertension    Hypertriglyceridemia    Latent tuberculosis    Completed treatment 10/2019    OBJECTIVE:  BP (!) 159/98   Pulse 64   Ht 5\' 2"  (1.575 m)   Wt 153 lb 12.8 oz (69.8 kg)   SpO2 100%   BMI 28.13 kg/m   General: NAD, pleasant, able to participate in exam Cardiac: RRR, no murmurs auscultated Respiratory: CTAB, normal WOB Abdomen: soft, non-tender, non-distended, normoactive bowel sounds Extremities: warm and well perfused, no edema or cyanosis Skin: warm and dry, no rashes noted Neuro: alert, no obvious focal deficits, speech normal Psych: Normal affect and mood  ASSESSMENT/PLAN:   Essential hypertension Assessment & Plan: Blood pressure is not well-controlled today; however, he has not been taking his medications until 1 day ago.  Asymptomatic.  Will restart his amlodipine and losartan-HCTZ, follow-up in about 2 weeks for BP check likely repeat BMP at that time.  Orders: -     Basic metabolic panel -     amLODIPine Besylate; Take 1 tablet (10 mg total) by mouth at bedtime.  Dispense: 90 tablet; Refill: 3 -     Losartan Potassium-HCTZ; Take 1 tablet by mouth daily.  Dispense: 30 tablet; Refill: 2   Meds ordered this encounter  Medications   amLODipine (NORVASC) 10 MG tablet    Sig: Take 1 tablet (10 mg total) by mouth at bedtime.    Dispense:  90 tablet    Refill:  3   losartan-hydrochlorothiazide (HYZAAR) 100-25 MG  tablet    Sig: Take 1 tablet by mouth daily.    Dispense:  30 tablet    Refill:  2   Return in about 2 weeks (around 09/04/2022).  Vonna Drafts, MD [facility]

## 2022-08-21 NOTE — Patient Instructions (Signed)
Please follow up in 2-3 weeks to check your BP Please take amlodipine and Hyzaar as prescribed I will call if any of your results from today are abnormal  Blood Pressure Record Sheet To take your blood pressure, you will need a blood pressure machine. You can buy a blood pressure machine (blood pressure monitor) at your clinic, drug store, or online. When choosing one, consider: An automatic monitor that has an arm cuff. A cuff that wraps snugly around your upper arm. You should be able to fit only one finger between your arm and the cuff. A device that stores blood pressure reading results. Do not choose a monitor that measures your blood pressure from your wrist or finger. Follow your health care provider's instructions for how to take your blood pressure. To use this form: Take your blood pressure medications every day These measurements should be taken when you have been at rest for at least 10-15 min Take at least 2 readings with each blood pressure check. This makes sure the results are correct. Wait 1-2 minutes between measurements. Write down the results in the spaces on this form. Keep in mind it should always be recorded systolic over diastolic. Both numbers are important.  Repeat this every day for 2-3 weeks, or as told by your health care provider.  Make a follow-up appointment with your health care provider to discuss the results.  Blood Pressure Log Date Medications taken? (Y/N) Blood Pressure Time of Day

## 2022-08-21 NOTE — Assessment & Plan Note (Signed)
Blood pressure is not well-controlled today; however, he has not been taking his medications until 1 day ago.  Asymptomatic.  Will restart his amlodipine and losartan-HCTZ, follow-up in about 2 weeks for BP check likely repeat BMP at that time.

## 2022-08-22 LAB — BASIC METABOLIC PANEL
BUN/Creatinine Ratio: 9 (ref 9–20)
BUN: 11 mg/dL (ref 6–24)
CO2: 24 mmol/L (ref 20–29)
Calcium: 10 mg/dL (ref 8.7–10.2)
Chloride: 98 mmol/L (ref 96–106)
Creatinine, Ser: 1.23 mg/dL (ref 0.76–1.27)
Glucose: 81 mg/dL (ref 70–99)
Potassium: 3.8 mmol/L (ref 3.5–5.2)
Sodium: 138 mmol/L (ref 134–144)
eGFR: 74 mL/min/{1.73_m2} (ref >59)

## 2022-08-24 ENCOUNTER — Encounter: Payer: Self-pay | Admitting: Family Medicine

## 2022-09-04 ENCOUNTER — Ambulatory Visit (INDEPENDENT_AMBULATORY_CARE_PROVIDER_SITE_OTHER): Payer: Self-pay | Admitting: Family Medicine

## 2022-09-04 VITALS — BP 132/86 | HR 65 | Wt 156.8 lb

## 2022-09-04 DIAGNOSIS — I10 Essential (primary) hypertension: Secondary | ICD-10-CM

## 2022-09-04 NOTE — Assessment & Plan Note (Signed)
Well-controlled on Norvasc, losartan-HCTZ.  132/86 in office today.  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  Follow-up in 2 to 3 months.

## 2022-09-04 NOTE — Patient Instructions (Signed)
Please continue taking your amlodipine as well as your losartan-hydrochlorothiazide  I am checking some blood work today.  If anything is abnormal I will give you a call.  If not, I will send you a letter in the mail.  Please follow-up in about 2 to 3 months.

## 2022-09-04 NOTE — Progress Notes (Signed)
  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 was started on amlodipine, losartan-HCTZ which he had been off of for a little while Today, his blood pressure is well-controlled Unable to obtain home BP cuff but is willing to buy this  PERTINENT  PMH / PSH:   Past Medical History:  Diagnosis Date   Arthritis    Hip dysplasia, acquired, left    Hypertension    Hypertriglyceridemia    Latent tuberculosis    Completed treatment 10/2019    OBJECTIVE:  BP 132/86   Pulse 65   Wt 156 lb 12.8 oz (71.1 kg)   SpO2 100%   BMI 28.68 kg/m   General: NAD, pleasant, able to participate in exam Cardiac: RRR, no murmurs auscultated Respiratory: CTAB, normal WOB Abdomen: soft, non-tender, non-distended, normoactive bowel sounds Extremities: warm and well perfused, no edema or cyanosis Skin: warm and dry, no rashes noted Neuro: alert, no obvious focal deficits, speech normal Psych: Normal affect and mood  ASSESSMENT/PLAN:   Essential hypertension Assessment & Plan: Well-controlled on Norvasc, losartan-HCTZ.  132/86 in office today.  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  Follow-up in 2 to 3 months.  Orders: -     Basic metabolic panel   No orders of the defined types were placed in this encounter.  Return in about 3 months (around 12/05/2022) for HTN.  Vonna Drafts, MD [facility]

## 2022-09-08 ENCOUNTER — Encounter: Payer: Self-pay | Admitting: Family Medicine
